# Patient Record
Sex: Female | Born: 1975 | Race: White | Hispanic: No | Marital: Married | Smoking: Never smoker
Health system: Southern US, Community
[De-identification: ages and names within clinical notes are randomized; demographics above are authoritative.]

## PROBLEM LIST (undated history)

## (undated) DIAGNOSIS — R609 Edema, unspecified: Secondary | ICD-10-CM

## (undated) DIAGNOSIS — D649 Anemia, unspecified: Secondary | ICD-10-CM

## (undated) DIAGNOSIS — F418 Other specified anxiety disorders: Secondary | ICD-10-CM

## (undated) DIAGNOSIS — M1990 Unspecified osteoarthritis, unspecified site: Secondary | ICD-10-CM

## (undated) DIAGNOSIS — M5134 Other intervertebral disc degeneration, thoracic region: Secondary | ICD-10-CM

## (undated) DIAGNOSIS — M51369 Other intervertebral disc degeneration, lumbar region without mention of lumbar back pain or lower extremity pain: Secondary | ICD-10-CM

## (undated) DIAGNOSIS — G4700 Insomnia, unspecified: Secondary | ICD-10-CM

## (undated) DIAGNOSIS — Z8739 Personal history of other diseases of the musculoskeletal system and connective tissue: Secondary | ICD-10-CM

## (undated) DIAGNOSIS — N63 Unspecified lump in unspecified breast: Secondary | ICD-10-CM

## (undated) DIAGNOSIS — Z8719 Personal history of other diseases of the digestive system: Secondary | ICD-10-CM

## (undated) DIAGNOSIS — R9431 Abnormal electrocardiogram [ECG] [EKG]: Secondary | ICD-10-CM

## (undated) DIAGNOSIS — M5136 Other intervertebral disc degeneration, lumbar region: Secondary | ICD-10-CM

## (undated) DIAGNOSIS — G629 Polyneuropathy, unspecified: Secondary | ICD-10-CM

## (undated) DIAGNOSIS — Z87442 Personal history of urinary calculi: Secondary | ICD-10-CM

## (undated) DIAGNOSIS — E039 Hypothyroidism, unspecified: Secondary | ICD-10-CM

## (undated) DIAGNOSIS — B86 Scabies: Secondary | ICD-10-CM

## (undated) DIAGNOSIS — R002 Palpitations: Secondary | ICD-10-CM

## (undated) DIAGNOSIS — M5126 Other intervertebral disc displacement, lumbar region: Secondary | ICD-10-CM

## (undated) DIAGNOSIS — R519 Headache, unspecified: Secondary | ICD-10-CM

## (undated) DIAGNOSIS — M797 Fibromyalgia: Secondary | ICD-10-CM

## (undated) DIAGNOSIS — I7774 Dissection of vertebral artery: Secondary | ICD-10-CM

## (undated) DIAGNOSIS — R5381 Other malaise: Secondary | ICD-10-CM

## (undated) DIAGNOSIS — J189 Pneumonia, unspecified organism: Secondary | ICD-10-CM

## (undated) DIAGNOSIS — K589 Irritable bowel syndrome without diarrhea: Secondary | ICD-10-CM

## (undated) DIAGNOSIS — Z973 Presence of spectacles and contact lenses: Secondary | ICD-10-CM

## (undated) DIAGNOSIS — T7840XA Allergy, unspecified, initial encounter: Secondary | ICD-10-CM

## (undated) DIAGNOSIS — IMO0002 Reserved for concepts with insufficient information to code with codable children: Secondary | ICD-10-CM

## (undated) DIAGNOSIS — M5124 Other intervertebral disc displacement, thoracic region: Secondary | ICD-10-CM

## (undated) DIAGNOSIS — K219 Gastro-esophageal reflux disease without esophagitis: Secondary | ICD-10-CM

## (undated) DIAGNOSIS — R5383 Other fatigue: Secondary | ICD-10-CM

## (undated) DIAGNOSIS — I639 Cerebral infarction, unspecified: Secondary | ICD-10-CM

## (undated) DIAGNOSIS — I10 Essential (primary) hypertension: Secondary | ICD-10-CM

## (undated) DIAGNOSIS — M549 Dorsalgia, unspecified: Secondary | ICD-10-CM

## (undated) DIAGNOSIS — M542 Cervicalgia: Secondary | ICD-10-CM

## (undated) DIAGNOSIS — N390 Urinary tract infection, site not specified: Secondary | ICD-10-CM

## (undated) DIAGNOSIS — I341 Nonrheumatic mitral (valve) prolapse: Secondary | ICD-10-CM

## (undated) HISTORY — PX: CHOLECYSTECTOMY: SHX55

## (undated) HISTORY — DX: Scabies: B86

## (undated) HISTORY — DX: Dissection of vertebral artery: I77.74

## (undated) HISTORY — DX: Allergy, unspecified, initial encounter: T78.40XA

## (undated) HISTORY — DX: Nonrheumatic mitral (valve) prolapse: I34.1

## (undated) HISTORY — PX: MOUTH SURGERY: SHX715

## (undated) HISTORY — DX: Edema, unspecified: R60.9

## (undated) HISTORY — DX: Reserved for concepts with insufficient information to code with codable children: IMO0002

## (undated) HISTORY — DX: Cervicalgia: M54.2

## (undated) HISTORY — DX: Other fatigue: R53.83

## (undated) HISTORY — DX: Urinary tract infection, site not specified: N39.0

## (undated) HISTORY — DX: Dorsalgia, unspecified: M54.9

## (undated) HISTORY — DX: Other malaise: R53.81

## (undated) HISTORY — DX: Unspecified osteoarthritis, unspecified site: M19.90

## (undated) HISTORY — DX: Insomnia, unspecified: G47.00

## (undated) HISTORY — DX: Other specified anxiety disorders: F41.8

## (undated) HISTORY — DX: Essential (primary) hypertension: I10

## (undated) HISTORY — PX: BREAST EXCISIONAL BIOPSY: SUR124

## (undated) HISTORY — PX: OTHER SURGICAL HISTORY: SHX169

## (undated) HISTORY — DX: Irritable bowel syndrome, unspecified: K58.9

---

## 1998-06-25 ENCOUNTER — Other Ambulatory Visit: Admission: RE | Admit: 1998-06-25 | Discharge: 1998-06-25 | Payer: Self-pay | Admitting: Obstetrics & Gynecology

## 1998-11-08 ENCOUNTER — Ambulatory Visit (HOSPITAL_COMMUNITY): Admission: RE | Admit: 1998-11-08 | Discharge: 1998-11-08 | Payer: Self-pay | Admitting: Obstetrics & Gynecology

## 1999-02-04 ENCOUNTER — Inpatient Hospital Stay (HOSPITAL_COMMUNITY): Admission: AD | Admit: 1999-02-04 | Discharge: 1999-02-04 | Payer: Self-pay | Admitting: Family Medicine

## 1999-02-09 ENCOUNTER — Inpatient Hospital Stay (HOSPITAL_COMMUNITY): Admission: AD | Admit: 1999-02-09 | Discharge: 1999-02-11 | Payer: Self-pay | Admitting: Obstetrics & Gynecology

## 2000-12-05 ENCOUNTER — Other Ambulatory Visit: Admission: RE | Admit: 2000-12-05 | Discharge: 2000-12-05 | Payer: Self-pay | Admitting: Obstetrics and Gynecology

## 2004-09-29 ENCOUNTER — Other Ambulatory Visit: Admission: RE | Admit: 2004-09-29 | Discharge: 2004-09-29 | Payer: Self-pay | Admitting: Obstetrics & Gynecology

## 2005-10-09 ENCOUNTER — Other Ambulatory Visit: Admission: RE | Admit: 2005-10-09 | Discharge: 2005-10-09 | Payer: Self-pay | Admitting: Obstetrics and Gynecology

## 2009-11-27 DIAGNOSIS — I7774 Dissection of vertebral artery: Secondary | ICD-10-CM

## 2009-11-27 DIAGNOSIS — I639 Cerebral infarction, unspecified: Secondary | ICD-10-CM

## 2009-11-27 HISTORY — DX: Dissection of vertebral artery: I77.74

## 2009-11-27 HISTORY — DX: Cerebral infarction, unspecified: I63.9

## 2011-06-23 ENCOUNTER — Other Ambulatory Visit: Payer: Self-pay | Admitting: Obstetrics and Gynecology

## 2011-06-23 DIAGNOSIS — N644 Mastodynia: Secondary | ICD-10-CM

## 2011-06-27 ENCOUNTER — Other Ambulatory Visit: Payer: Self-pay | Admitting: Obstetrics and Gynecology

## 2011-06-27 ENCOUNTER — Ambulatory Visit
Admission: RE | Admit: 2011-06-27 | Discharge: 2011-06-27 | Disposition: A | Discharge status: Home / Self Care | Payer: BC Managed Care – PPO | Source: Ambulatory Visit | Attending: Obstetrics and Gynecology | Admitting: Obstetrics and Gynecology

## 2011-06-27 DIAGNOSIS — N644 Mastodynia: Secondary | ICD-10-CM

## 2011-07-03 ENCOUNTER — Inpatient Hospital Stay: Admission: RE | Admit: 2011-07-03 | Payer: BC Managed Care – PPO | Source: Ambulatory Visit

## 2011-07-06 ENCOUNTER — Ambulatory Visit
Admission: RE | Admit: 2011-07-06 | Discharge: 2011-07-06 | Disposition: A | Discharge status: Home / Self Care | Payer: BC Managed Care – PPO | Source: Ambulatory Visit | Attending: Obstetrics and Gynecology | Admitting: Obstetrics and Gynecology

## 2011-07-06 DIAGNOSIS — N644 Mastodynia: Secondary | ICD-10-CM

## 2011-10-13 ENCOUNTER — Encounter: Payer: Self-pay | Admitting: Vascular Surgery

## 2011-11-15 ENCOUNTER — Encounter: Payer: Self-pay | Admitting: Vascular Surgery

## 2011-11-16 ENCOUNTER — Encounter: Payer: BC Managed Care – PPO | Admitting: Vascular Surgery

## 2012-05-15 ENCOUNTER — Other Ambulatory Visit: Payer: Self-pay | Admitting: Obstetrics and Gynecology

## 2012-05-26 IMAGING — US US CORE BIOPSY
1 series · 12 of 12 positions shown · non-contrast
Comparison: none

***ADDENDUM*** CREATED: 07/07/2011 [DATE]

Pathology revealed a fibroadenoma in the left breast. This was
found to be concordant by Dr. Gbenyo Rosemary. Pathology was relayed
by telephone. The patient reported doing well after the biopsy with
minimal oozing at the biopsy site. She is currently on aspirin
therapy. Post biopsy instructions were reviewed and her questions
were answered. She was encouraged to call The [REDACTED] for any additional concerns. She was asked to
continue with monthly self breast examination and to return at age
40 for bilateral screening mammography.
Pathology results are dictated by Iteneusz Fluder RN, BSN on [place]
***END ADDENDUM*** SIGNED BY: Shahriar Joshua, M.D.
CLINICAL DATA: Left breast mass, the patient requests biopsy

[Series 2: us core biopsy · 12 of 12 slices shown]
[im 1/12]
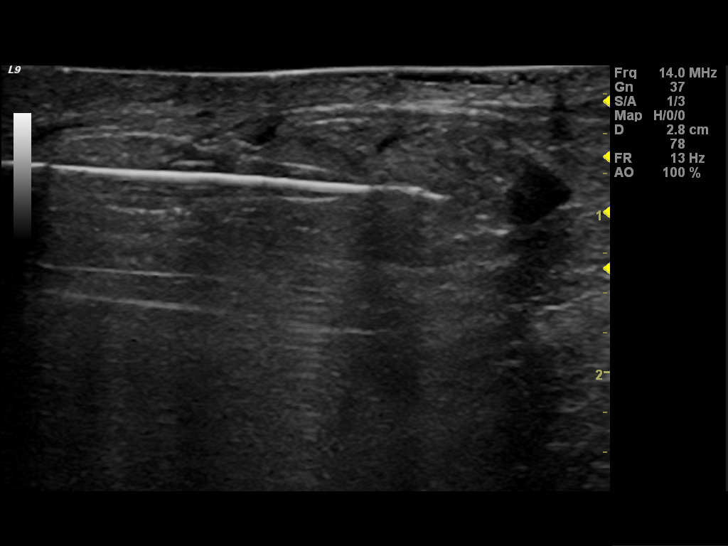
[im 2/12]
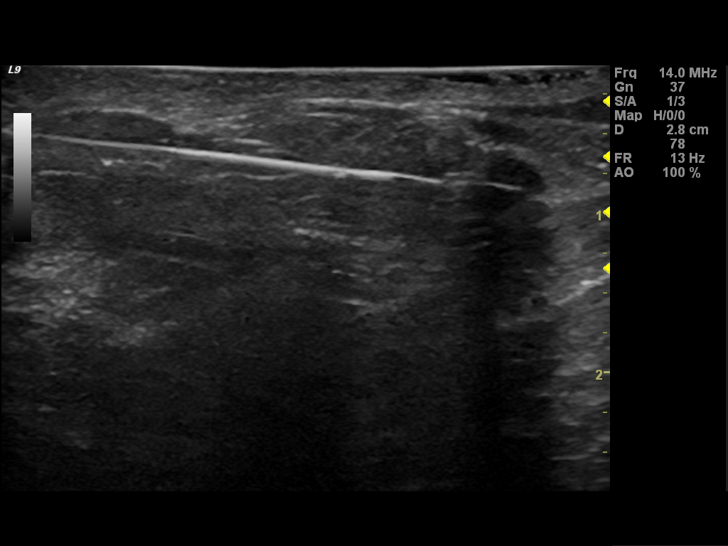
[im 3/12]
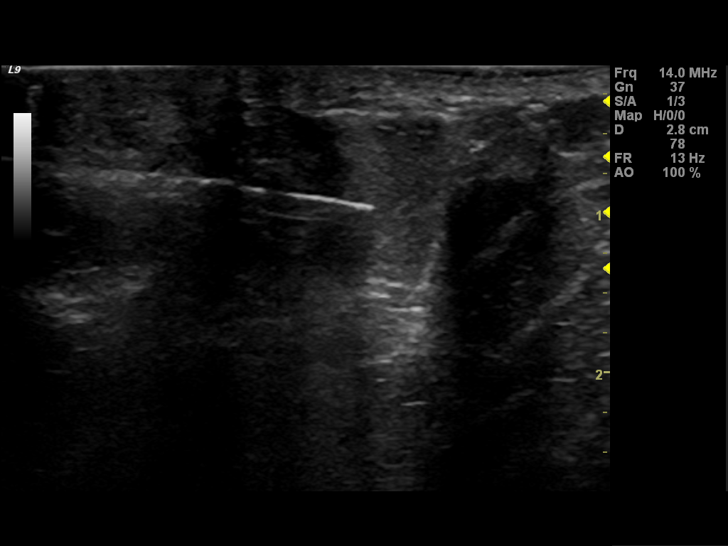
[im 4/12]
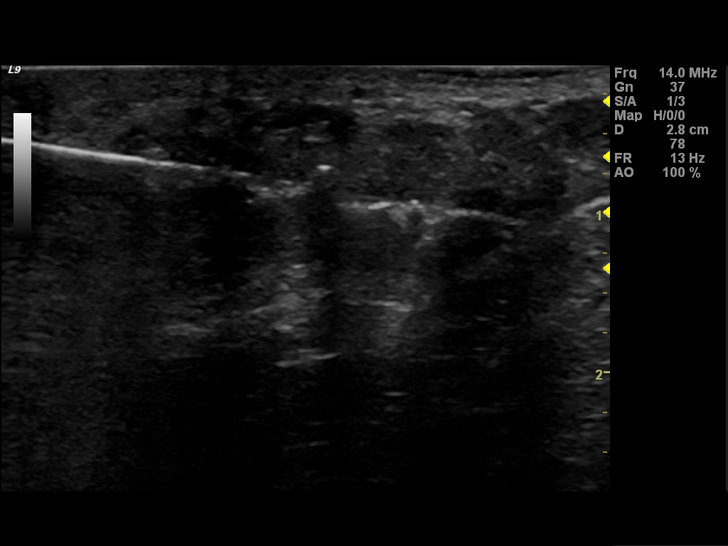
[im 5/12]
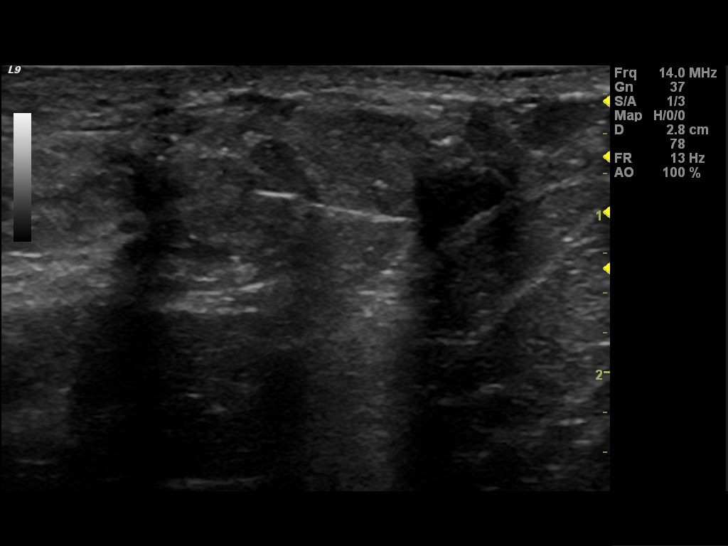
[im 6/12]
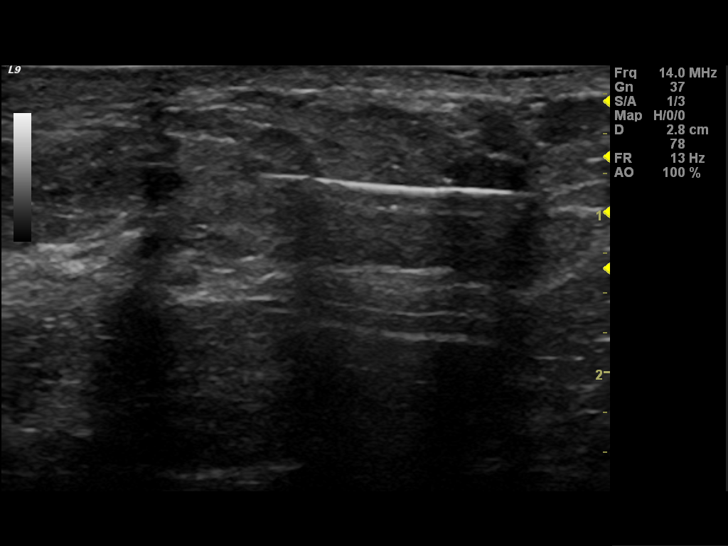
[im 7/12]
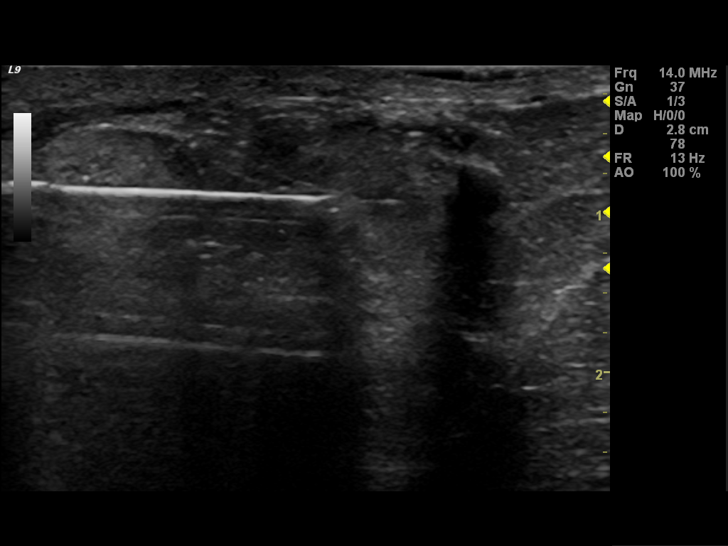
[im 8/12]
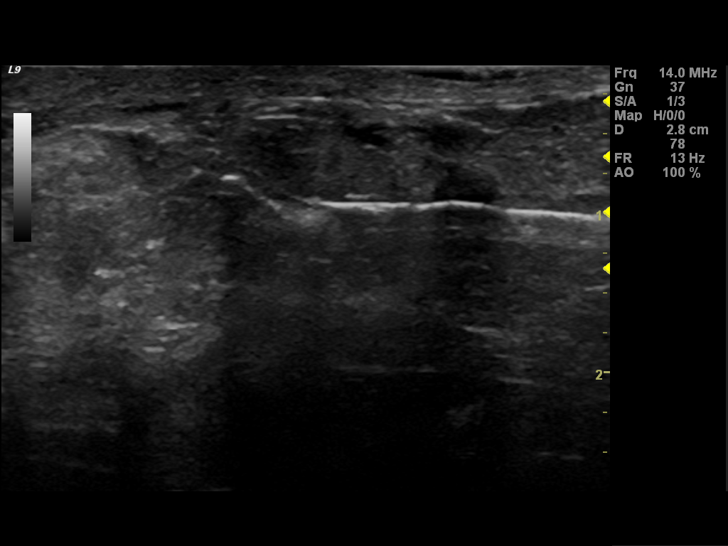
[im 9/12]
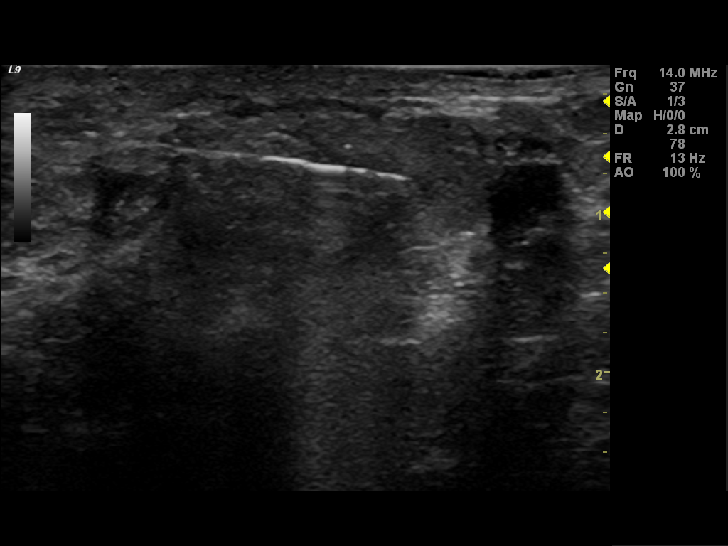
[im 10/12]
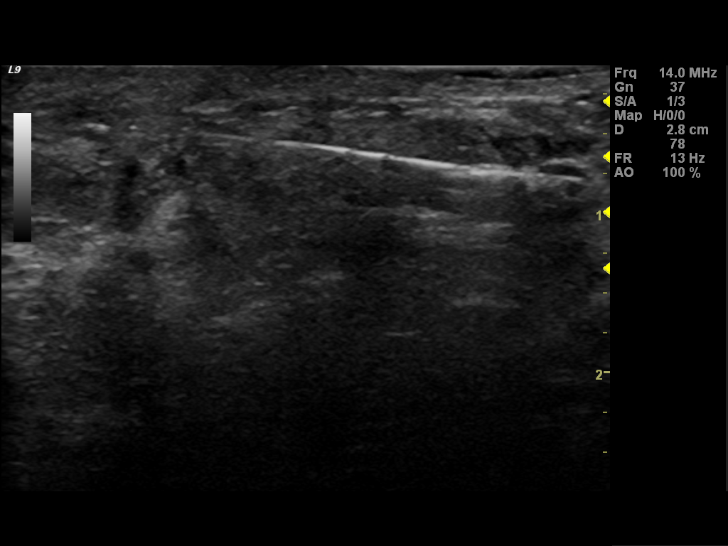
[im 11/12]
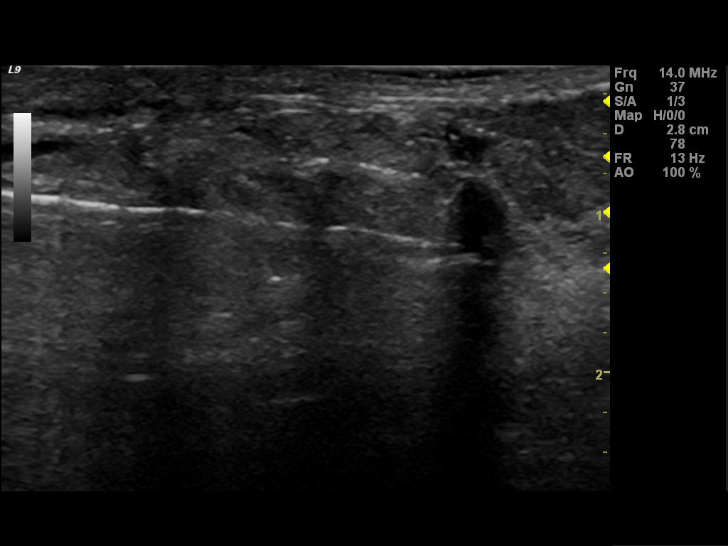
[im 12/12]
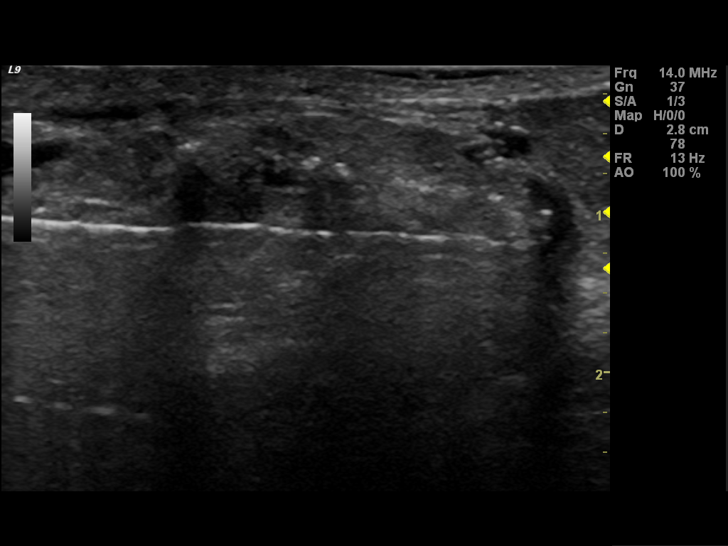

[12 of 12 positions shown; findings below may reference images not displayed]

ULTRASOUND GUIDED CORE BIOPSY OF THE LEFT BREAST

I met with the patient, and we discussed the procedure of
ultrasound-guided biopsy, including risks, benefits, and
alternatives.  Specifically, we discussed the risks of infection,
bleeding, tissue injury, clip migration, and inadequate sampling.
Informed, written consent was given.

Using sterile technique,   lidocaine, ultrasound guidance, and a 14
gauge automated biopsy device, biopsy was performed of 1 cm mass at
the left breast 10 o'clock position 3 cm from nipple.  At the
conclusion of the procedure, a tissue marker clip was deployed into
the biopsy cavity.  Follow-up 2-view mammogram was performed and
dictated separately.
IMPRESSION: Ultrasound-guided biopsy of left breast mass.  No apparent
complications.

## 2013-04-30 ENCOUNTER — Other Ambulatory Visit: Payer: Self-pay | Admitting: Internal Medicine

## 2013-05-14 ENCOUNTER — Ambulatory Visit
Admission: RE | Admit: 2013-05-14 | Discharge: 2013-05-14 | Disposition: A | Discharge status: Home / Self Care | Payer: Self-pay | Source: Ambulatory Visit | Attending: Internal Medicine | Admitting: Internal Medicine

## 2013-10-28 ENCOUNTER — Institutional Professional Consult (permissible substitution): Payer: Self-pay | Admitting: Cardiovascular Disease

## 2013-11-06 ENCOUNTER — Ambulatory Visit: Payer: 59 | Admitting: Neurology

## 2013-12-11 ENCOUNTER — Institutional Professional Consult (permissible substitution): Payer: Self-pay | Admitting: Cardiovascular Disease

## 2013-12-18 DIAGNOSIS — B86 Scabies: Secondary | ICD-10-CM

## 2013-12-18 HISTORY — DX: Scabies: B86

## 2013-12-21 DIAGNOSIS — M549 Dorsalgia, unspecified: Secondary | ICD-10-CM

## 2013-12-21 DIAGNOSIS — R5381 Other malaise: Secondary | ICD-10-CM

## 2013-12-21 DIAGNOSIS — R5383 Other fatigue: Secondary | ICD-10-CM

## 2013-12-21 HISTORY — DX: Other fatigue: R53.83

## 2013-12-21 HISTORY — DX: Other malaise: R53.81

## 2013-12-21 HISTORY — DX: Dorsalgia, unspecified: M54.9

## 2013-12-26 ENCOUNTER — Institutional Professional Consult (permissible substitution): Payer: Self-pay | Admitting: Cardiovascular Disease

## 2014-01-01 ENCOUNTER — Institutional Professional Consult (permissible substitution): Payer: Self-pay | Admitting: Cardiovascular Disease

## 2014-01-22 ENCOUNTER — Institutional Professional Consult (permissible substitution): Payer: Self-pay | Admitting: Cardiovascular Disease

## 2014-02-17 ENCOUNTER — Institutional Professional Consult (permissible substitution): Payer: Self-pay | Admitting: Cardiovascular Disease

## 2018-09-27 ENCOUNTER — Other Ambulatory Visit: Payer: Self-pay

## 2018-09-27 ENCOUNTER — Other Ambulatory Visit: Payer: Self-pay | Admitting: Obstetrics and Gynecology

## 2018-09-27 ENCOUNTER — Encounter (HOSPITAL_BASED_OUTPATIENT_CLINIC_OR_DEPARTMENT_OTHER): Payer: Self-pay

## 2018-09-27 NOTE — Progress Notes (Signed)
Spoke with: Morey Hummingbird NPO:  After Midnight, no gum, candy, or mints   Arrival time: 1030AM Labs: (CBC, BMP, EKG 09/30/2018) AM medications: Levothyroxine, Loratadine, Omeprazole Pre op orders: Yes Ride home:  Chace Bisch (fiance') [phone]

## 2018-09-30 ENCOUNTER — Encounter (HOSPITAL_COMMUNITY)
Admission: RE | Admit: 2018-09-30 | Discharge: 2018-09-30 | Disposition: A | Discharge status: Home / Self Care | Payer: Medicaid Other | Source: Ambulatory Visit | Attending: Obstetrics and Gynecology | Admitting: Obstetrics and Gynecology

## 2018-09-30 DIAGNOSIS — I10 Essential (primary) hypertension: Secondary | ICD-10-CM | POA: Insufficient documentation

## 2018-09-30 DIAGNOSIS — N750 Cyst of Bartholin's gland: Secondary | ICD-10-CM | POA: Insufficient documentation

## 2018-09-30 DIAGNOSIS — I498 Other specified cardiac arrhythmias: Secondary | ICD-10-CM | POA: Insufficient documentation

## 2018-09-30 LAB — BASIC METABOLIC PANEL
Anion gap: 6 (ref 5–15)
BUN: 16 mg/dL (ref 6–20)
CHLORIDE: 102 mmol/L (ref 98–111)
CO2: 31 mmol/L (ref 22–32)
Calcium: 9 mg/dL (ref 8.9–10.3)
Creatinine, Ser: 0.91 mg/dL (ref 0.44–1.00)
GFR calc Af Amer: >60 mL/min (ref >60)
GLUCOSE: 87 mg/dL (ref 70–99)
POTASSIUM: 3.2 mmol/L — AB (ref 3.5–5.1)
SODIUM: 139 mmol/L (ref 135–145)

## 2018-09-30 LAB — CBC
HCT: 42.3 % (ref 36.0–46.0)
HEMOGLOBIN: 13.8 g/dL (ref 12.0–15.0)
MCH: 27.7 pg (ref 26.0–34.0)
MCHC: 32.6 g/dL (ref 30.0–36.0)
MCV: 84.9 fL (ref 80.0–100.0)
NRBC: 0 % (ref 0.0–0.2)
PLATELETS: 236 10*3/uL (ref 150–400)
RBC: 4.98 MIL/uL (ref 3.87–5.11)
RDW: 12.6 % (ref 11.5–15.5)
WBC: 7.3 10*3/uL (ref 4.0–10.5)

## 2018-09-30 NOTE — Progress Notes (Signed)
Spoke with dr Gerre Scull nodal office no old ekg at office, faxed [facility] to see if any old ekg done there, fax confirmation received.

## 2018-10-01 ENCOUNTER — Encounter (HOSPITAL_COMMUNITY)
Admission: RE | Admit: 2018-10-01 | Discharge: 2018-10-01 | Disposition: A | Discharge status: Home / Self Care | Payer: Medicaid Other | Source: Ambulatory Visit | Attending: Obstetrics and Gynecology | Admitting: Obstetrics and Gynecology

## 2018-10-01 NOTE — Progress Notes (Signed)
Spoke with dr foster anesthia about ekg results 09-30-18 and old ekg results Sep 24 2015 done at [place] and patient medical history.. Patient ok for surgery per dr foster.

## 2018-10-02 NOTE — H&P (Signed)
31 [email or web address] of 8-10 cm BGD cyst that increased in size over a weekend  No signs of acute infection.  Past Medical History:  Diagnosis Date  . Abnormal EKG    Short P wave noted per pt, no treatment at this time  . Allergy   . Arthritis   . Asthma    environmental triggers  . Backache, unspecified 12/21/2013  . Benign breast lumps    Bilateral  . Borderline anemia    with pregnancy  . Bulging lumbar disc   . Bulging of thoracic intervertebral disc   . Cervicalgia   . DDD (degenerative disc disease)   . Depression with anxiety   . Edema    Generalized  . Fibromyalgia   . GERD (gastroesophageal reflux disease)   . History of fibromuscular dysplasia   . History of kidney stones   . History of pancreatitis   . Hypertension   . Hypothyroidism   . IBS (irritable bowel syndrome)   . Insomnia   . Lumbar herniated disc    L4-L5, S1, pressing on nerve, has been severe and caused right leg and foot to stop working (this has gotten better)  . Malaise and fatigue 12/21/2013  . Mitral valve prolapse   . Pneumonia   . Scabies 12/18/2013  . Stroke [facility]) 2011   slight right side weakness, right side slight facial asymetry, no too noticeable  . UTI (urinary tract infection)   . 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 Wears contact lenses   . Wears glasses    Past Surgical History:  Procedure Laterality Date  . BREAST EXCISIONAL BIOPSY Left   . CHOLECYSTECTOMY    . epidural steroid injections    . MOUTH SURGERY     tooth extraction age 56    Social History   Socioeconomic History  . Marital status: Single    Spouse name: Not on file  . Number of children: Not on file  . Years of education: Not on file  . Highest education level: Not on file  Occupational History  . Not on file  Social Needs  . Financial resource strain: Not on file  . Food insecurity:    Worry: Not on file    Inability: Not  on file  . Transportation needs:    Medical: Not on file    Non-medical: Not on file  Tobacco Use  . Smoking status: Never Smoker  . Smokeless tobacco: Never Used  Substance and Sexual Activity  . Alcohol use: Never    Frequency: Never  . Drug use: Never  . Sexual activity: Not on file    Comment: Vasectomy  Lifestyle  . Physical activity:    Days per week: Not on file    Minutes per session: Not on file  . Stress: Not on file  Relationships  . Social connections:    Talks on phone: Not on file    Gets together: Not on file    Attends religious service: Not on file    Active member of club or organization: Not on file    Attends meetings of clubs or organizations: Not on file    Relationship status: Not on file  . Intimate partner violence:    Fear of current or ex partner: Not on file    Emotionally abused: Not on file    Physically abused: Not on file    Forced sexual activity:  Not on file  Other Topics Concern  . Not on file  Social History Narrative  . Not on file    No current facility-administered medications on file prior to encounter.    Current Outpatient Medications on File Prior to Encounter  Medication Sig Dispense Refill  . lisinopril (PRINIVIL,ZESTRIL) 5 MG tablet Take 5 mg by mouth daily.    Marland Kitchen omeprazole (PRILOSEC) 20 MG capsule Take 20 mg by mouth as needed.    Marland Kitchen aspirin EC 81 MG tablet Take 81 mg by mouth daily.     . cyclobenzaprine (FLEXERIL) 10 MG tablet Take 10 mg by mouth 3 (three) times daily as needed for muscle spasms.    . diazepam (VALIUM) 10 MG tablet Take 10 mg by mouth every 6 (six) hours as needed for anxiety.    . furosemide (LASIX) 20 MG tablet Take 20 mg by mouth as needed.    . hydrochlorothiazide (HYDRODIURIL) 25 MG tablet Take 25 mg by mouth every evening.     Marland Kitchen levothyroxine (SYNTHROID, LEVOTHROID) 50 MCG tablet Take 50 mcg by mouth every morning.     . loratadine-pseudoephedrine (CLARITIN-D 24-HOUR) 10-240 MG per 24 hr tablet  Take 1 tablet by mouth daily.      . multivitamin-iron-minerals-folic acid (CENTRUM) chewable tablet Chew 1 tablet by mouth daily.      . valACYclovir (VALTREX) 1000 MG tablet Take 1,000 mg by mouth as needed.       Allergies  Allergen Reactions  . Iodine Anaphylaxis    Contrast dye  . Morphine And Related Nausea And Vomiting  . Shellfish Allergy Swelling    Lips swelling and wheezing  . Tylenol [Acetaminophen]     Causes elevated liver enzymes    Vitals:   09/27/18 1637  Weight: 71.7 kg  Height: 5' 4.5" (1.638 m)    Lungs: clear to ascultation Cor:  RRR Abdomen:  soft, nontender, nondistended. Ex:  no cords, erythema Pelvic:  8-10 cm BGD cyst on R.  Normal vaginal otherwise.  Normal uterus and cervix/  A:  For BGD cyst excision and marsupulization.   P: P: All risks, benefits and alternatives d/w patient and she desires to proceed.   SCDs during the operation.    Korina Tretter A

## 2018-10-03 ENCOUNTER — Encounter (HOSPITAL_BASED_OUTPATIENT_CLINIC_OR_DEPARTMENT_OTHER)
Admission: RE | Disposition: A | Discharge status: Home / Self Care | Payer: Self-pay | Source: Ambulatory Visit | Attending: Obstetrics and Gynecology

## 2018-10-03 ENCOUNTER — Ambulatory Visit (HOSPITAL_BASED_OUTPATIENT_CLINIC_OR_DEPARTMENT_OTHER): Payer: Self-pay | Admitting: Anesthesiology

## 2018-10-03 ENCOUNTER — Other Ambulatory Visit: Payer: Self-pay

## 2018-10-03 ENCOUNTER — Encounter (HOSPITAL_BASED_OUTPATIENT_CLINIC_OR_DEPARTMENT_OTHER): Payer: Self-pay | Admitting: *Deleted

## 2018-10-03 ENCOUNTER — Ambulatory Visit (HOSPITAL_BASED_OUTPATIENT_CLINIC_OR_DEPARTMENT_OTHER)
Admission: RE | Admit: 2018-10-03 | Discharge: 2018-10-03 | Disposition: A | Discharge status: Home / Self Care | Payer: Self-pay | Source: Ambulatory Visit | Attending: Obstetrics and Gynecology | Admitting: Obstetrics and Gynecology

## 2018-10-03 DIAGNOSIS — Z886 Allergy status to analgesic agent status: Secondary | ICD-10-CM | POA: Insufficient documentation

## 2018-10-03 DIAGNOSIS — Z8673 Personal history of transient ischemic attack (TIA), and cerebral infarction without residual deficits: Secondary | ICD-10-CM | POA: Insufficient documentation

## 2018-10-03 DIAGNOSIS — G4700 Insomnia, unspecified: Secondary | ICD-10-CM | POA: Insufficient documentation

## 2018-10-03 DIAGNOSIS — E039 Hypothyroidism, unspecified: Secondary | ICD-10-CM | POA: Insufficient documentation

## 2018-10-03 DIAGNOSIS — Z87442 Personal history of urinary calculi: Secondary | ICD-10-CM | POA: Insufficient documentation

## 2018-10-03 DIAGNOSIS — I10 Essential (primary) hypertension: Secondary | ICD-10-CM | POA: Insufficient documentation

## 2018-10-03 DIAGNOSIS — N750 Cyst of Bartholin's gland: Secondary | ICD-10-CM | POA: Insufficient documentation

## 2018-10-03 DIAGNOSIS — K219 Gastro-esophageal reflux disease without esophagitis: Secondary | ICD-10-CM | POA: Insufficient documentation

## 2018-10-03 DIAGNOSIS — M1990 Unspecified osteoarthritis, unspecified site: Secondary | ICD-10-CM | POA: Insufficient documentation

## 2018-10-03 DIAGNOSIS — J45909 Unspecified asthma, uncomplicated: Secondary | ICD-10-CM | POA: Insufficient documentation

## 2018-10-03 DIAGNOSIS — I341 Nonrheumatic mitral (valve) prolapse: Secondary | ICD-10-CM | POA: Insufficient documentation

## 2018-10-03 DIAGNOSIS — F418 Other specified anxiety disorders: Secondary | ICD-10-CM | POA: Insufficient documentation

## 2018-10-03 DIAGNOSIS — Z885 Allergy status to narcotic agent status: Secondary | ICD-10-CM | POA: Insufficient documentation

## 2018-10-03 DIAGNOSIS — Z7982 Long term (current) use of aspirin: Secondary | ICD-10-CM | POA: Insufficient documentation

## 2018-10-03 DIAGNOSIS — M797 Fibromyalgia: Secondary | ICD-10-CM | POA: Insufficient documentation

## 2018-10-03 DIAGNOSIS — K589 Irritable bowel syndrome without diarrhea: Secondary | ICD-10-CM | POA: Insufficient documentation

## 2018-10-03 DIAGNOSIS — Z79899 Other long term (current) drug therapy: Secondary | ICD-10-CM | POA: Insufficient documentation

## 2018-10-03 HISTORY — DX: Personal history of other diseases of the musculoskeletal system and connective tissue: Z87.39

## 2018-10-03 HISTORY — DX: Unspecified lump in unspecified breast: N63.0

## 2018-10-03 HISTORY — DX: Other intervertebral disc degeneration, lumbar region: M51.36

## 2018-10-03 HISTORY — DX: Abnormal electrocardiogram (ECG) (EKG): R94.31

## 2018-10-03 HISTORY — DX: Presence of spectacles and contact lenses: Z97.3

## 2018-10-03 HISTORY — DX: Personal history of other diseases of the digestive system: Z87.19

## 2018-10-03 HISTORY — DX: Cerebral infarction, unspecified: I63.9

## 2018-10-03 HISTORY — DX: Personal history of urinary calculi: Z87.442

## 2018-10-03 HISTORY — PX: BARTHOLIN CYST MARSUPIALIZATION: SHX5383

## 2018-10-03 HISTORY — DX: Other intervertebral disc displacement, thoracic region: M51.24

## 2018-10-03 HISTORY — DX: Gastro-esophageal reflux disease without esophagitis: K21.9

## 2018-10-03 HISTORY — DX: Other intervertebral disc degeneration, thoracic region: M51.34

## 2018-10-03 HISTORY — DX: Other intervertebral disc degeneration, lumbar region without mention of lumbar back pain or lower extremity pain: M51.369

## 2018-10-03 HISTORY — DX: Anemia, unspecified: D64.9

## 2018-10-03 HISTORY — DX: Pneumonia, unspecified organism: J18.9

## 2018-10-03 HISTORY — DX: Fibromyalgia: M79.7

## 2018-10-03 HISTORY — DX: Other intervertebral disc displacement, lumbar region: M51.26

## 2018-10-03 HISTORY — DX: Hypothyroidism, unspecified: E03.9

## 2018-10-03 LAB — POCT PREGNANCY, URINE: Preg Test, Ur: NEGATIVE

## 2018-10-03 SURGERY — MARSUPIALIZATION, CYST, BARTHOLIN'S GLAND
Anesthesia: General | Site: Perineum

## 2018-10-03 MED ORDER — DEXAMETHASONE SODIUM PHOSPHATE 10 MG/ML IJ SOLN
INTRAMUSCULAR | Status: DC | PRN
  Administered 2018-10-03: 10 mg via INTRAVENOUS

## 2018-10-03 MED ORDER — EPHEDRINE SULFATE-NACL 50-0.9 MG/10ML-% IV SOSY
PREFILLED_SYRINGE | INTRAVENOUS | Status: DC | PRN
  Administered 2018-10-03: 10 mg via INTRAVENOUS

## 2018-10-03 MED ORDER — ONDANSETRON HCL 4 MG/2ML IJ SOLN
INTRAMUSCULAR | Status: AC
  Filled 2018-10-03: qty 2

## 2018-10-03 MED ORDER — HYDROMORPHONE HCL 1 MG/ML IJ SOLN
0.2500 mg | INTRAMUSCULAR | Status: DC | PRN
  Filled 2018-10-03: qty 0.5

## 2018-10-03 MED ORDER — SOD CITRATE-CITRIC ACID 500-334 MG/5ML PO SOLN
ORAL | Status: AC
  Filled 2018-10-03: qty 15

## 2018-10-03 MED ORDER — LIDOCAINE-EPINEPHRINE 1 %-1:100000 IJ SOLN
INTRAMUSCULAR | Status: DC | PRN
  Administered 2018-10-03: 10 mL

## 2018-10-03 MED ORDER — SOD CITRATE-CITRIC ACID 500-334 MG/5ML PO SOLN
30.0000 mL | ORAL | Status: AC
  Administered 2018-10-03: 30 mL via ORAL
  Filled 2018-10-03: qty 30

## 2018-10-03 MED ORDER — DEXAMETHASONE SODIUM PHOSPHATE 10 MG/ML IJ SOLN
INTRAMUSCULAR | Status: AC
  Filled 2018-10-03: qty 1

## 2018-10-03 MED ORDER — IBUPROFEN 800 MG PO TABS: 800.0000 mg | ORAL_TABLET | Freq: Three times a day (TID) | ORAL | 0 refills | Status: DC | PRN

## 2018-10-03 MED ORDER — FENTANYL CITRATE (PF) 100 MCG/2ML IJ SOLN
INTRAMUSCULAR | Status: AC
  Filled 2018-10-03: qty 2

## 2018-10-03 MED ORDER — OXYCODONE HCL 5 MG PO TABS: 5.0000 mg | ORAL_TABLET | Freq: Four times a day (QID) | ORAL | 0 refills | Status: DC | PRN

## 2018-10-03 MED ORDER — MIDAZOLAM HCL 2 MG/2ML IJ SOLN
INTRAMUSCULAR | Status: AC
  Filled 2018-10-03: qty 2

## 2018-10-03 MED ORDER — MIDAZOLAM HCL 2 MG/2ML IJ SOLN
INTRAMUSCULAR | Status: DC | PRN
  Administered 2018-10-03: 2 mg via INTRAVENOUS

## 2018-10-03 MED ORDER — FENTANYL CITRATE (PF) 100 MCG/2ML IJ SOLN
INTRAMUSCULAR | Status: DC | PRN
  Administered 2018-10-03: 50 ug via INTRAVENOUS

## 2018-10-03 MED ORDER — EPHEDRINE 5 MG/ML INJ
INTRAVENOUS | Status: AC
  Filled 2018-10-03: qty 10

## 2018-10-03 MED ORDER — LACTATED RINGERS IV SOLN
INTRAVENOUS | Status: DC
  Administered 2018-10-03: 12:00:00 via INTRAVENOUS
  Filled 2018-10-03: qty 1000

## 2018-10-03 MED ORDER — PROMETHAZINE HCL 25 MG/ML IJ SOLN
6.2500 mg | INTRAMUSCULAR | Status: DC | PRN
  Filled 2018-10-03: qty 1

## 2018-10-03 MED ORDER — PROPOFOL 10 MG/ML IV BOLUS
INTRAVENOUS | Status: DC | PRN
  Administered 2018-10-03: 170 mg via INTRAVENOUS

## 2018-10-03 MED ORDER — LIDOCAINE 2% (20 MG/ML) 5 ML SYRINGE
INTRAMUSCULAR | Status: DC | PRN
  Administered 2018-10-03: 50 mg via INTRAVENOUS

## 2018-10-03 SURGICAL SUPPLY — 28 items
BLADE SURG 15 STRL LF DISP TIS (BLADE) ×1 IMPLANT
BLADE SURG 15 STRL SS (BLADE) ×3
CATH BARTHOLIN GLAND 10FR 5CM (CATHETERS) IMPLANT
CATH ROBINSON RED A/P 16FR (CATHETERS) ×3 IMPLANT
DRAPE SHEET LG 3/4 BI-LAMINATE (DRAPES) ×6 IMPLANT
ELECT REM PT RETURN 9FT ADLT (ELECTROSURGICAL)
ELECTRODE REM PT RTRN 9FT ADLT (ELECTROSURGICAL) IMPLANT
GLOVE BIO SURGEON STRL SZ7 (GLOVE) ×3 IMPLANT
GLOVE BIOGEL PI IND STRL 7.0 (GLOVE) ×1 IMPLANT
GLOVE BIOGEL PI INDICATOR 7.0 (GLOVE) ×2
GOWN STRL REUS W/TWL LRG LVL3 (GOWN DISPOSABLE) ×6 IMPLANT
NEEDLE HYPO 22GX1.5 SAFETY (NEEDLE) ×3 IMPLANT
NS IRRIG 1000ML POUR BTL (IV SOLUTION) ×3 IMPLANT
PACK VAGINAL MINOR WOMEN LF (CUSTOM PROCEDURE TRAY) ×3 IMPLANT
PAD OB MATERNITY 4.3X12.25 (PERSONAL CARE ITEMS) ×1 IMPLANT
PAD PREP 24X48 CUFFED NSTRL (MISCELLANEOUS) ×3 IMPLANT
PENCIL BUTTON HOLSTER BLD 10FT (ELECTRODE) ×1 IMPLANT
SUT VIC AB 3-0 CT1 36 (SUTURE) ×4 IMPLANT
SUT VIC AB 3-0 SH 27 (SUTURE) ×3
SUT VIC AB 3-0 SH 27X BRD (SUTURE) ×1 IMPLANT
SUT VICRYL RAPIDE 3 0 (SUTURE) ×2 IMPLANT
SWAB COLLECTION DEVICE MRSA (MISCELLANEOUS) IMPLANT
SWAB CULTURE ESWAB REG 1ML (MISCELLANEOUS) IMPLANT
SYR CONTROL 10ML LL (SYRINGE) ×3 IMPLANT
TOWEL OR 17X24 6PK STRL BLUE (TOWEL DISPOSABLE) ×6 IMPLANT
TUBE CONNECTING 12'X1/4 (SUCTIONS)
TUBE CONNECTING 12X1/4 (SUCTIONS) IMPLANT
YANKAUER SUCT BULB TIP NO VENT (SUCTIONS) IMPLANT

## 2018-10-03 NOTE — Discharge Instructions (Signed)
°  Post Anesthesia Home Care Instructions  Activity: Get plenty of rest for the remainder of the day. A responsible individual must stay with you for 24 hours following the procedure.  For the next 24 hours, DO NOT: -Drive a car -Paediatric nurse -Drink alcoholic beverages -Take any medication unless instructed by your physician -Make any legal decisions or sign important papers.  Meals: Start with liquid foods such as gelatin or soup. Progress to regular foods as tolerated. Avoid greasy, spicy, heavy foods. If nausea and/or vomiting occur, drink only clear liquids until the nausea and/or vomiting subsides. Call your physician if vomiting continues.  Special Instructions/Symptoms: Your throat may feel dry or sore from the anesthesia or the breathing tube placed in your throat during surgery. If this causes discomfort, gargle with warm salt water. The discomfort should disappear within 24 hours.  If you had a scopolamine patch placed behind your ear for the management of post- operative nausea and/or vomiting:  1. The medication in the patch is effective for 72 hours, after which it should be removed.  Wrap patch in a tissue and discard in the trash. Wash hands thoroughly with soap and water. 2. You may remove the patch earlier than 72 hours if you experience unpleasant side effects which may include dry mouth, dizziness or visual disturbances. 3. Avoid touching the patch. Wash your hands with soap and water after contact with the patch.    Bartholin Cyst or Abscess A Bartholin cyst is a fluid-filled sac that forms on a Bartholin gland. Bartholin glands are small glands that are found in the folds of skin (labia) on the sides of the lower opening of the vagina. This type of cyst causes a bulge on the side of the vagina. A cyst that is not large or infected may not cause problems. However, if the fluid in the cyst becomes infected, the cyst can turn into an abscess. An abscess may cause  discomfort or pain. Follow these instructions at home:  Take medicines only as told by your doctor.  If you were prescribed an antibiotic medicine, finish all of it even if you start to feel better.  Apply warm, wet compresses to the area or take warm, shallow baths that cover your pelvic area (sitz baths). Do this many times each day or as told by your doctor.  Do not squeeze the cyst. Do not apply heavy pressure to it.  Do not have sex until the cyst has gone away.  If your cyst or abscess was opened by your doctor, a small piece of gauze or a drain may have been placed in the area. That lets the cyst drain. Do not remove the gauze or the drain until your doctor tells you it is okay to do that.  Do not wear tampons. Wear feminine pads as needed for any fluid or blood.  Keep all follow-up visits as told by your doctor. This is important. Contact a doctor if:  Your pain, puffiness (swelling), or redness in the area of the cyst gets worse.  You have fluid or pus pus coming from the cyst.  You have a fever. This information is not intended to replace advice given to you by your health care provider. Make sure you discuss any questions you have with your health care provider. Document Released: 02/09/2009 Document Revised: 04/20/2016 Document Reviewed: 06/29/2014 Elsevier Interactive Patient Education  2018 [employer].

## 2018-10-03 NOTE — Transfer of Care (Signed)
Immediate Anesthesia Transfer of Care Note  Patient: Patricia Griffin  Procedure(s) Performed: Procedure(s) (LRB): BARTHOLIN CYST MARSUPIALIZATION (N/A)  Patient Location: PACU  Anesthesia Type: General  Level of Consciousness: awake, oriented, sedated and patient cooperative  Airway & Oxygen Therapy: Patient Spontanous Breathing and Patient connected to face mask oxygen  Post-op Assessment: Report given to PACU RN and Post -op Vital signs reviewed and stable  Post vital signs: Reviewed and stable  Complications: No apparent anesthesia complications Last Vitals:  Vitals Value Taken Time  BP 139/84 10/03/2018  1:30 PM  Temp    Pulse 79 10/03/2018  1:41 PM  Resp 19 10/03/2018  1:41 PM  SpO2 99 % 10/03/2018  1:41 PM  Vitals shown include unvalidated device data.  Last Pain:  Vitals:   10/03/18 1324  TempSrc:   PainSc: 3

## 2018-10-03 NOTE — Anesthesia Procedure Notes (Signed)
Procedure Name: LMA Insertion Date/Time: 10/03/2018 12:22 PM Performed by: Suan Halter, CRNA Pre-anesthesia Checklist: Patient identified, Emergency Drugs available, Suction available and Patient being monitored Patient Re-evaluated:Patient Re-evaluated prior to induction Oxygen Delivery Method: Circle system utilized Preoxygenation: Pre-oxygenation with 100% oxygen Induction Type: IV induction Ventilation: Mask ventilation without difficulty LMA: LMA inserted LMA Size: 4.0 Number of attempts: 1 Airway Equipment and Method: Bite block Placement Confirmation: positive ETCO2 Tube secured with: Tape Dental Injury: Teeth and Oropharynx as per pre-operative assessment

## 2018-10-03 NOTE — Anesthesia Preprocedure Evaluation (Addendum)
Anesthesia Evaluation  Patient identified by MRN, date of birth, ID band Patient awake    Reviewed: Allergy & Precautions, NPO status , Patient's Chart, lab work & pertinent test results  Airway Mallampati: II  TM Distance: >3 FB Neck ROM: Full    Dental  (+) Teeth Intact, Dental Advisory Given   Pulmonary asthma ,    breath sounds clear to auscultation       Cardiovascular hypertension, Pt. on medications  Rhythm:Regular Rate:Normal     Neuro/Psych Anxiety Depression  Neuromuscular disease CVA, Residual Symptoms    GI/Hepatic Neg liver ROS, GERD  Medicated,  Endo/Other  Hypothyroidism   Renal/GU negative Renal ROS     Musculoskeletal  (+) Arthritis , Osteoarthritis,  Fibromyalgia -  Abdominal Normal abdominal exam  (+)   Peds  Hematology   Anesthesia Other Findings   Reproductive/Obstetrics                            Anesthesia Physical Anesthesia Plan  ASA: III  Anesthesia Plan: General   Post-op Pain Management:    Induction: Intravenous  PONV Risk Score and Plan: 4 or greater and Ondansetron, Dexamethasone, Midazolam and Scopolamine patch - Pre-op  Airway Management Planned: LMA  Additional Equipment: None  Intra-op Plan:   Post-operative Plan: Extubation in OR  Informed Consent: I have reviewed the patients History and Physical, chart, labs and discussed the procedure including the risks, benefits and alternatives for the proposed anesthesia with the patient or authorized representative who has indicated his/her understanding and acceptance.   Dental advisory given  Plan Discussed with: CRNA  Anesthesia Plan Comments:        Anesthesia Quick Evaluation

## 2018-10-03 NOTE — Op Note (Signed)
10/03/2018  1:17 PM  PATIENT:  Patricia Griffin  42 y.o. female  PRE-OPERATIVE DIAGNOSIS:  BARTHOLIN'S CYST  POST-OPERATIVE DIAGNOSIS:  BARTHOLIN'S CYST  PROCEDURE:  Procedure(s): BARTHOLIN CYST MARSUPIALIZATION (N/A)  SURGEON:  Surgeon(s) and Role:    * Bobbye Charleston, MD - Primary  ANESTHESIA:   general  EBL:  100 mL   LOCAL MEDICATIONS USED:  LIDOCAINE   SPECIMEN:  Source of Specimen:  Bartholins or vulvar duct cyst  DISPOSITION OF SPECIMEN:  PATHOLOGY  COUNTS:  YES  TOURNIQUET:  * No tourniquets in log *  DICTATION: .Note written in EPIC  PLAN OF CARE: Discharge to home after PACU  PATIENT DISPOSITION:  PACU - hemodynamically stable.   Delay start of Pharmacological VTE agent (>24hrs) due to surgical blood loss or risk of bleeding: not applicable  Complications: none    Findings:  Left BGD cyst extending from middle labia minora to outside of the vagina.  Cyst may be a gartner's instead of bartholin's because base of cyst was higher up inside the vagina instead of near perineum.  About 50 cc of sterile pus was inside cyst.   Technique:  After adequate anesthesia was achieved, the patient was prepped and draped in usual sterile fashion.  The BGD cyst on the left was identified and the vaginal mucosa was marked right at the base of the cyst.  The scalpel was used to incise the vaginal mucosa partly inside and partly outside the hymenal ring secondary the cyst extended so deeply. .  The cyst was identified and sharply excised with the scalpel.  A few portions of cyst wall anteriorly and laterally were then bluntly dissected from the vaginal or vulvar mucosa and then excised with the metzenbaums.  The base of the gland was anterior and inside the vagina and the base of the cyst with the entry to the gland left intact.  Below the posterior edge of the cyst wall there was a defect in the perineum.  A closure of the left perineal area deep to the mucosa was done with 2-0  vicryl to ensure hemostasis.  The vaginal mucosa over this was closed with 2-0 vicryl.    The remaining cyst wall base was then sutured to the vaginal mucosa around the edges of both in a circumferential manner, thus keeping an opening of 2 cm of the cyst.  This was done with 3-O vicryl R.   The area was watched for a minute to make sure there was no hematoma underneath developing and the edges were hemostatic. Lidocaine with epi was injected into the incision area to help with pain control. The patient tolerated the procedure well and was returned to the recovery room in stable condition.    Trevin Gartrell A

## 2018-10-03 NOTE — Progress Notes (Signed)
There has been no change in the patients history, status or exam since the history and physical.  Vitals:   09/27/18 1637 10/03/18 1031 10/03/18 1113 10/03/18 1142  BP:  138/85    Pulse:  66    Resp:  20    Temp:  98.6 F (37 C)    TempSrc:  Oral    SpO2:  100%    Weight: 71.7 kg   73 kg  Height: 5' 4.5" (1.638 m)  5\' 4"  (1.626 m)     Results for orders placed or performed during the hospital encounter of 10/03/18 (from the past 72 hour(s))  Pregnancy, urine POC     Status: None   Collection Time: 10/03/18 10:40 AM  Result Value Ref Range   Preg Test, Ur NEGATIVE NEGATIVE    Comment:        THE SENSITIVITY OF THIS METHODOLOGY IS >24 mIU/mL     Magdala Brahmbhatt A

## 2018-10-03 NOTE — Anesthesia Postprocedure Evaluation (Signed)
Anesthesia Post Note  Patient: Patricia Griffin  Procedure(s) Performed: BARTHOLIN CYST MARSUPIALIZATION (N/A Perineum)     Patient location during evaluation: PACU Anesthesia Type: General Level of consciousness: sedated Pain management: pain level controlled Vital Signs Assessment: post-procedure vital signs reviewed and stable Respiratory status: spontaneous breathing and respiratory function stable Cardiovascular status: stable Postop Assessment: no apparent nausea or vomiting Anesthetic complications: no    Last Vitals:  Vitals:   10/03/18 1400 10/03/18 1415  BP: 120/78 118/83  Pulse: 81 75  Resp: 15 17  Temp:    SpO2: 99% 96%    Last Pain:  Vitals:   10/03/18 1415  TempSrc:   PainSc: 2                  Biruk Troia DANIEL

## 2018-10-03 NOTE — Brief Op Note (Signed)
10/03/2018  1:17 PM  PATIENT:  Patricia Griffin  42 y.o. female  PRE-OPERATIVE DIAGNOSIS:  BARTHOLIN'S CYST  POST-OPERATIVE DIAGNOSIS:  BARTHOLIN'S CYST  PROCEDURE:  Procedure(s): BARTHOLIN CYST MARSUPIALIZATION (N/A)  SURGEON:  Surgeon(s) and Role:    * Bobbye Charleston, MD - Primary  ANESTHESIA:   general  EBL:  100 mL   LOCAL MEDICATIONS USED:  LIDOCAINE   SPECIMEN:  Source of Specimen:  Bartholins or vulvar duct cyst  DISPOSITION OF SPECIMEN:  PATHOLOGY  COUNTS:  YES  TOURNIQUET:  * No tourniquets in log *  DICTATION: .Note written in EPIC  PLAN OF CARE: Discharge to home after PACU  PATIENT DISPOSITION:  PACU - hemodynamically stable.   Delay start of Pharmacological VTE agent (>24hrs) due to surgical blood loss or risk of bleeding: not applicable

## 2018-10-04 ENCOUNTER — Encounter (HOSPITAL_BASED_OUTPATIENT_CLINIC_OR_DEPARTMENT_OTHER): Payer: Self-pay | Admitting: Obstetrics and Gynecology

## 2020-04-07 ENCOUNTER — Other Ambulatory Visit: Payer: Self-pay | Admitting: Radiology

## 2020-04-07 ENCOUNTER — Other Ambulatory Visit: Payer: Self-pay | Admitting: Physician Assistant

## 2020-04-07 DIAGNOSIS — Z803 Family history of malignant neoplasm of breast: Secondary | ICD-10-CM

## 2020-04-07 DIAGNOSIS — Z1501 Genetic susceptibility to malignant neoplasm of breast: Secondary | ICD-10-CM

## 2020-04-07 DIAGNOSIS — Z1231 Encounter for screening mammogram for malignant neoplasm of breast: Secondary | ICD-10-CM

## 2020-04-14 ENCOUNTER — Ambulatory Visit
Admission: RE | Admit: 2020-04-14 | Discharge: 2020-04-14 | Disposition: A | Discharge status: Home / Self Care | Payer: Medicaid Other | Source: Ambulatory Visit | Attending: Physician Assistant | Admitting: Physician Assistant

## 2020-04-14 ENCOUNTER — Other Ambulatory Visit: Payer: Self-pay

## 2020-04-14 ENCOUNTER — Ambulatory Visit: Payer: Medicaid Other

## 2020-04-14 DIAGNOSIS — Z1231 Encounter for screening mammogram for malignant neoplasm of breast: Secondary | ICD-10-CM

## 2020-04-16 ENCOUNTER — Other Ambulatory Visit: Payer: Self-pay | Admitting: Physician Assistant

## 2020-04-16 DIAGNOSIS — R928 Other abnormal and inconclusive findings on diagnostic imaging of breast: Secondary | ICD-10-CM

## 2020-04-19 ENCOUNTER — Other Ambulatory Visit: Payer: Self-pay | Admitting: Physician Assistant

## 2020-04-21 ENCOUNTER — Other Ambulatory Visit: Payer: No Typology Code available for payment source

## 2020-04-22 ENCOUNTER — Ambulatory Visit: Payer: No Typology Code available for payment source

## 2020-04-22 ENCOUNTER — Ambulatory Visit
Admission: RE | Admit: 2020-04-22 | Discharge: 2020-04-22 | Disposition: A | Discharge status: Home / Self Care | Payer: No Typology Code available for payment source | Source: Ambulatory Visit | Attending: Physician Assistant | Admitting: Physician Assistant

## 2020-04-22 ENCOUNTER — Other Ambulatory Visit: Payer: Self-pay

## 2020-04-22 DIAGNOSIS — R928 Other abnormal and inconclusive findings on diagnostic imaging of breast: Secondary | ICD-10-CM

## 2020-08-25 ENCOUNTER — Other Ambulatory Visit: Payer: Self-pay | Admitting: Obstetrics and Gynecology

## 2020-10-14 NOTE — Progress Notes (Signed)
[place]  10/14/2020      Your procedure is scheduled on  10/26/2020   Report to [place].M.  Call this number if you have problems the morning of surgery:[phone]  OUR ADDRESS IS [place], WE ARE LOCATED IN THE [facility]ICAL [facility] WITH [facility].   Remember:  Do not eat food  after midnight. NO SOLID FOOD AFTER MIDNIGHT THE NIGHT PRIOR TO SURGERY. NOTHING BY MOUTH EXCEPT CLEAR LIQUIDS UNTIL     0430am. PLEASE FINISH ENSURE DRINK PER SURGEON ORDER  WHICH NEEDS TO BE COMPLETED AT .0430am  Take these medicines the morning of surgery with A SIP OF WATER Synthroid   Do not wear jewelry, make-up or nail polish.  Do not wear lotions, powders, or perfumes, or deoderant.  Do not shave 48 hours prior to surgery.  Men may shave face and neck.  Do not bring valuables to the hospital.  [facility] is not responsible for any belongings or valuables.  Contacts, dentures or bridgework may not be worn into surgery.  Leave your suitcase in the car.  After surgery it may be brought to your room.  For patients admitted to the hospital, discharge time will be determined by your treatment team.  Patients discharged the day of surgery will not be allowed to drive home.     Please read over the following fact sheets that you were given: [facility] - Preparing for Surgery Before surgery, you can play an important role.  Because skin is not sterile, your skin needs to be as free of germs as possible.  You can reduce the number of germs on your skin by washing with CHG (chlorahexidine gluconate) soap before surgery.  CHG is an antiseptic cleaner which kills germs and bonds with the skin to continue killing germs even after washing. Please DO NOT use if you have an allergy to CHG or antibacterial soaps.  If your skin becomes reddened/irritated stop using the CHG and inform your nurse when you arrive at Short Stay. Do not shave (including legs and  underarms) for at least 48 hours prior to the first CHG shower.  You may shave your face/neck. Please follow these instructions carefully:  1.  Shower with CHG Soap the night before surgery and the  morning of Surgery.  2.  If you choose to wash your hair, wash your hair first as usual with your  normal  shampoo.  3.  After you shampoo, rinse your hair and body thoroughly to remove the  shampoo.                           4.  Use CHG as you would any other liquid soap.  You can apply chg directly  to the skin and wash                       Gently with a scrungie or clean washcloth.  5.  Apply the CHG Soap to your body ONLY FROM THE NECK DOWN.   Do not use on face/ open                           Wound or open sores. Avoid contact with eyes, ears mouth and genitals (private parts).  Wash face,  Genitals (private parts) with your normal soap.             6.  Wash thoroughly, paying special attention to the area where your surgery  will be performed.  7.  Thoroughly rinse your body with warm water from the neck down.  8.  DO NOT shower/wash with your normal soap after using and rinsing off  the CHG Soap.                9.  Pat yourself dry with a clean towel.            10.  Wear clean pajamas.            11.  Place clean sheets on your bed the night of your first shower and do not  sleep with pets. Day of Surgery : Do not apply any lotions/deodorants the morning of surgery.  Please wear clean clothes to the hospital/surgery center.  FAILURE TO FOLLOW THESE INSTRUCTIONS MAY RESULT IN THE CANCELLATION OF YOUR SURGERY PATIENT SIGNATURE_________________________________  NURSE SIGNATURE__________________________________  ________________________________________________________________________

## 2020-10-19 ENCOUNTER — Encounter (HOSPITAL_COMMUNITY)
Admission: RE | Admit: 2020-10-19 | Discharge: 2020-10-19 | Disposition: A | Discharge status: Home / Self Care | Payer: No Typology Code available for payment source | Source: Ambulatory Visit | Attending: Obstetrics and Gynecology | Admitting: Obstetrics and Gynecology

## 2020-10-26 ENCOUNTER — Encounter (HOSPITAL_BASED_OUTPATIENT_CLINIC_OR_DEPARTMENT_OTHER): Admission: RE | Payer: Self-pay | Source: Home / Self Care

## 2020-10-26 ENCOUNTER — Ambulatory Visit (HOSPITAL_BASED_OUTPATIENT_CLINIC_OR_DEPARTMENT_OTHER): Admission: RE | Admit: 2020-10-26 | Payer: 59 | Source: Home / Self Care | Admitting: Obstetrics and Gynecology

## 2020-10-26 SURGERY — XI ROBOTIC ASSISTED LAPAROSCOPIC HYSTERECTOMY AND SALPINGECTOMY
Anesthesia: General

## 2020-11-27 DIAGNOSIS — U071 COVID-19: Secondary | ICD-10-CM

## 2020-11-27 HISTORY — DX: COVID-19: U07.1

## 2021-03-13 IMAGING — MG MM DIGITAL DIAGNOSTIC UNILAT*R* W/ TOMO W/ CAD
6 series · 6 of 18 positions shown · non-contrast
Comparison: Previous exam(s).

CLINICAL DATA: 44-year-old female with possible RIGHT breast
asymmetry on 2D screening mammogram.Very strong family history of
breast cancer.

EXAM:
DIGITAL DIAGNOSTIC UNILATERAL RIGHT MAMMOGRAM WITH CAD AND TOMO

[R CC synth-2D (1 of 2)]
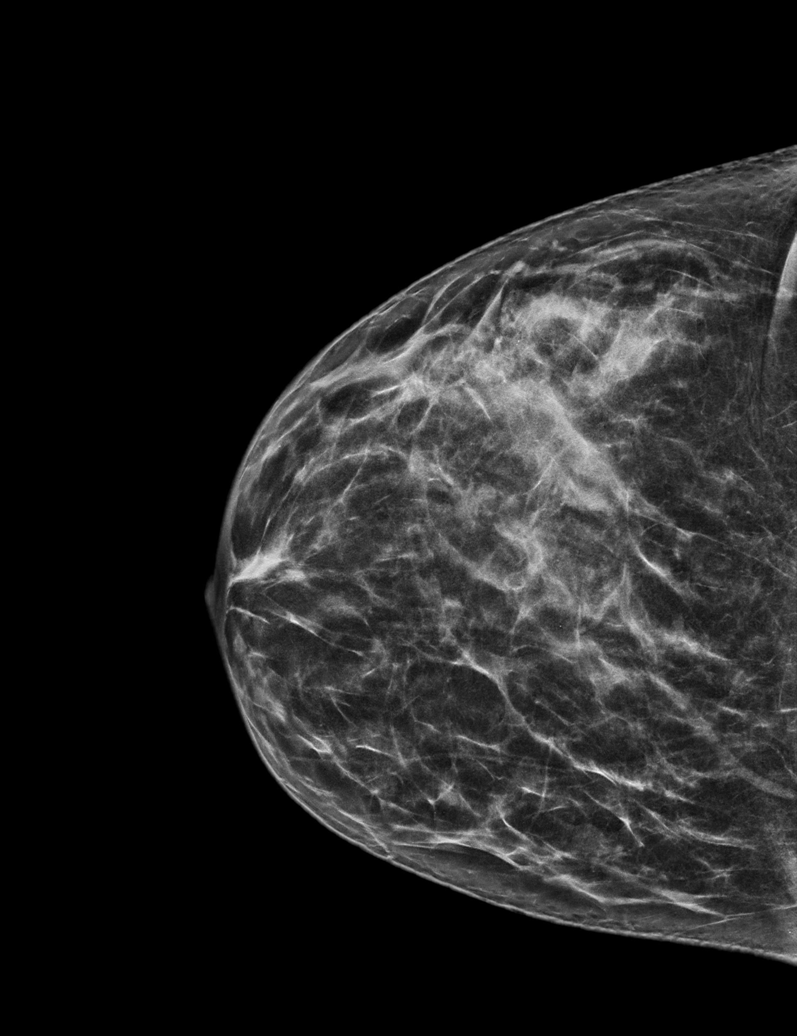

[R CC synth-2D (2 of 2)]
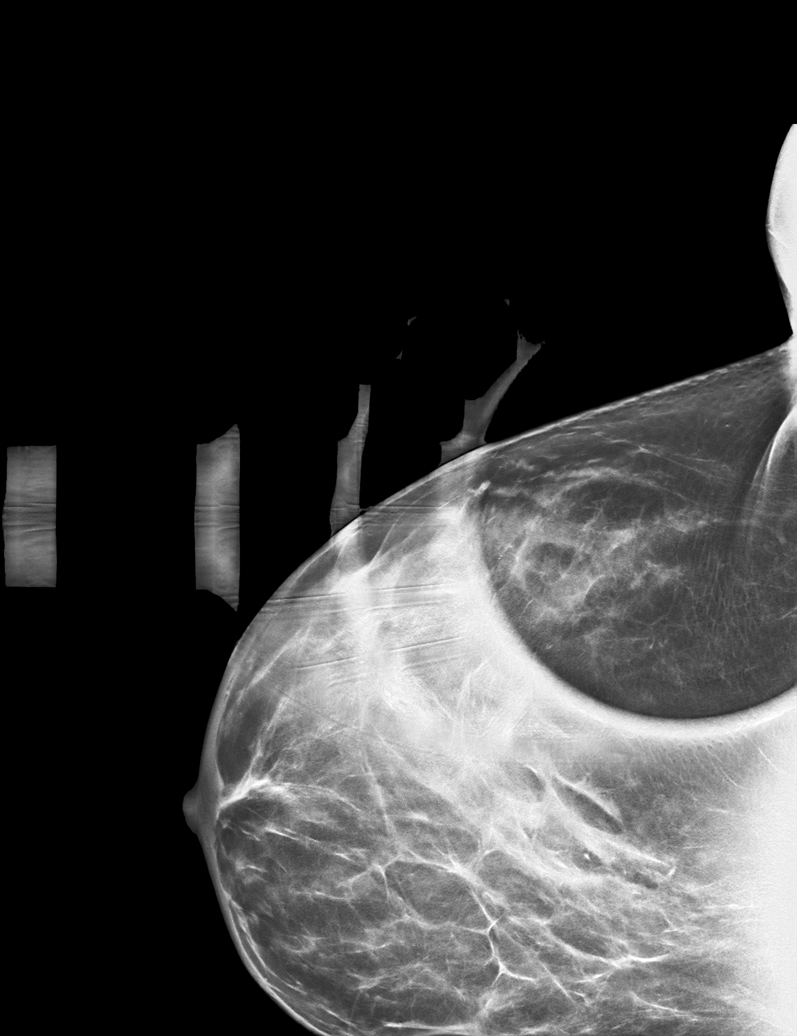

[R MLO synth-2D]
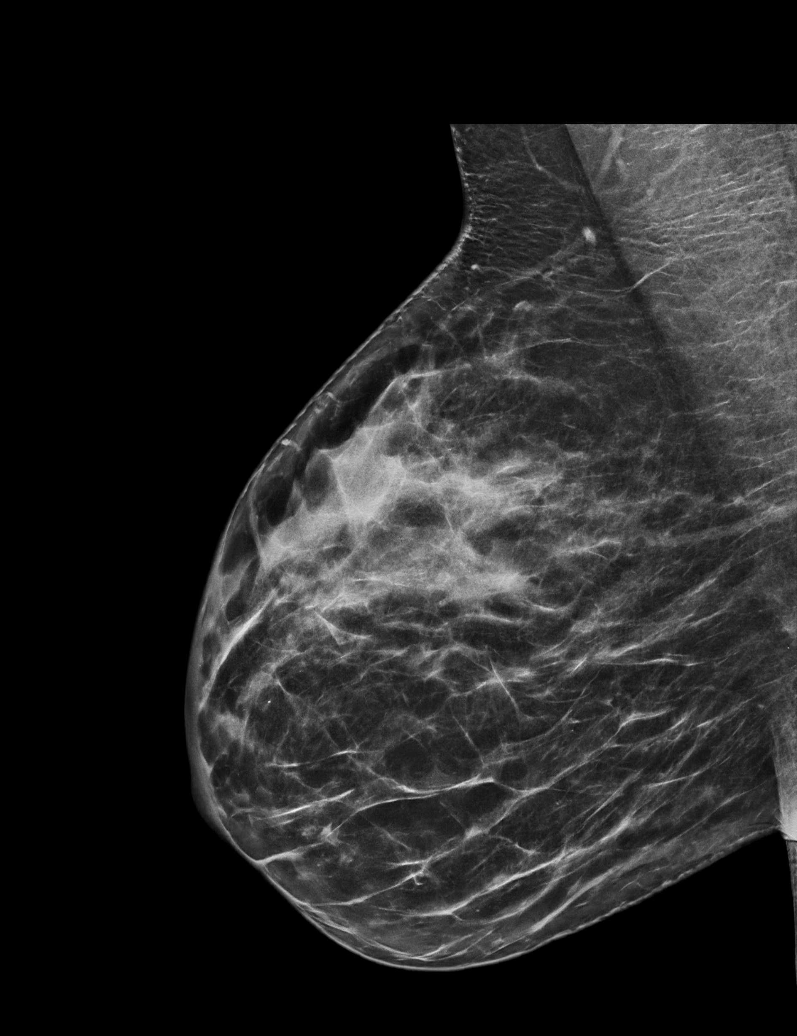

[R MLO tomo · tomo slice 33/65.0]
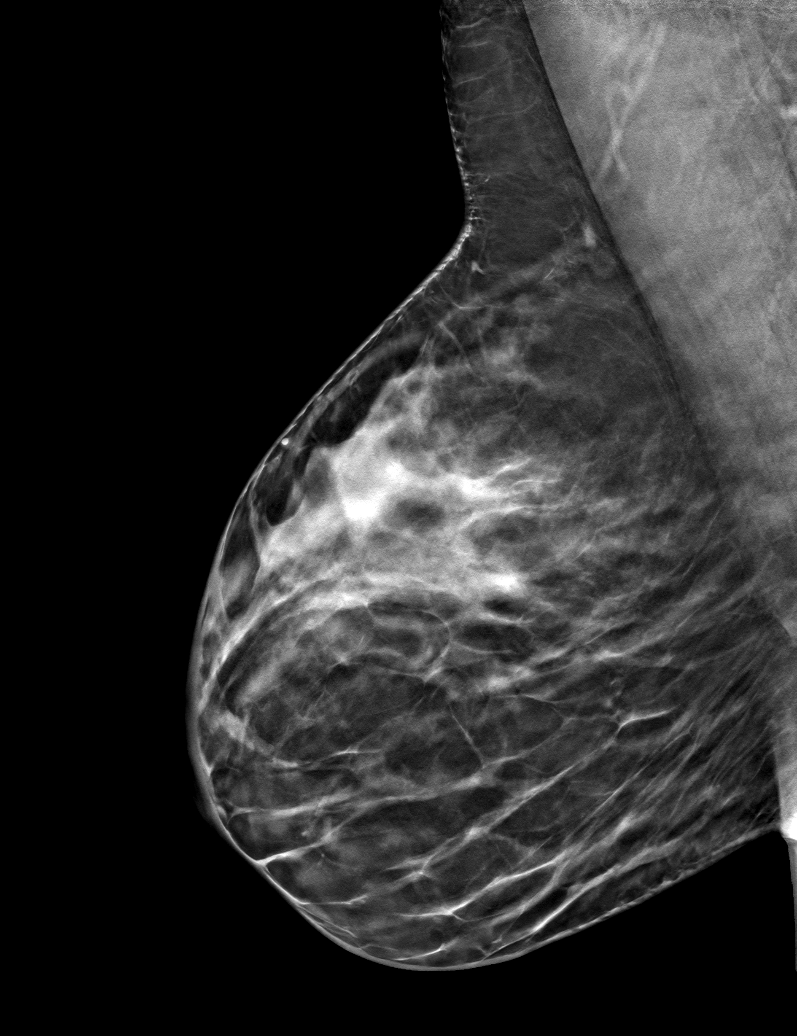

[R CC tomo (1 of 2) · tomo slice 29/58.0]
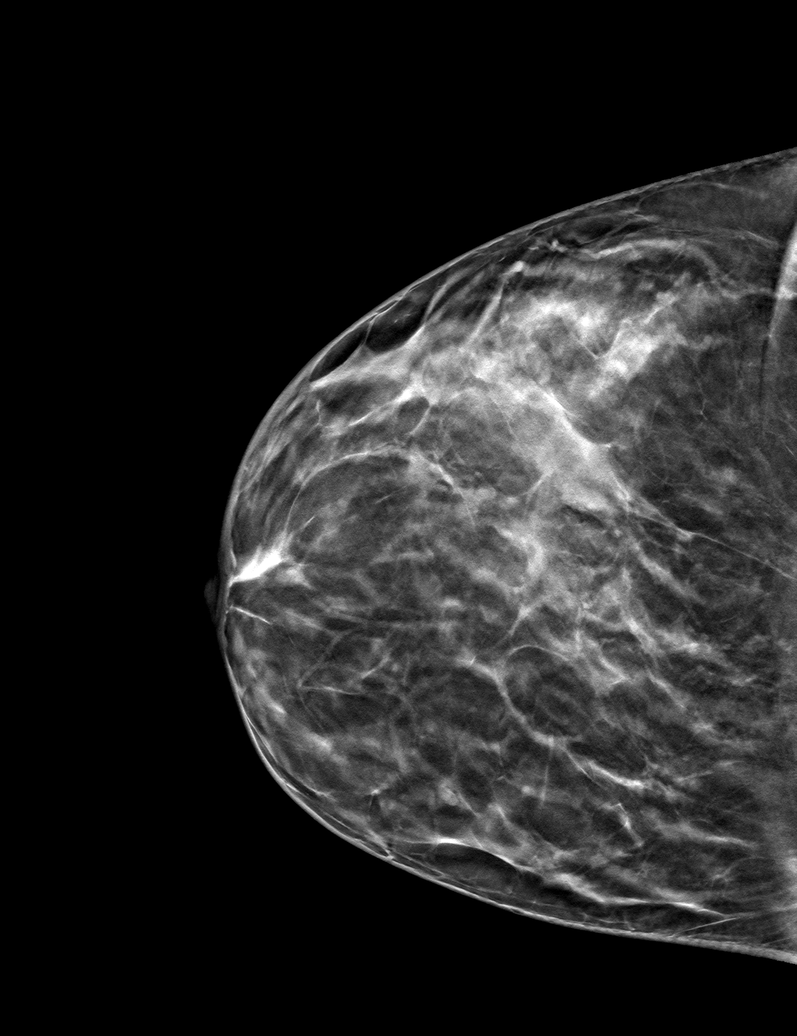

[R CC tomo (2 of 2) · tomo slice 29/57.0]
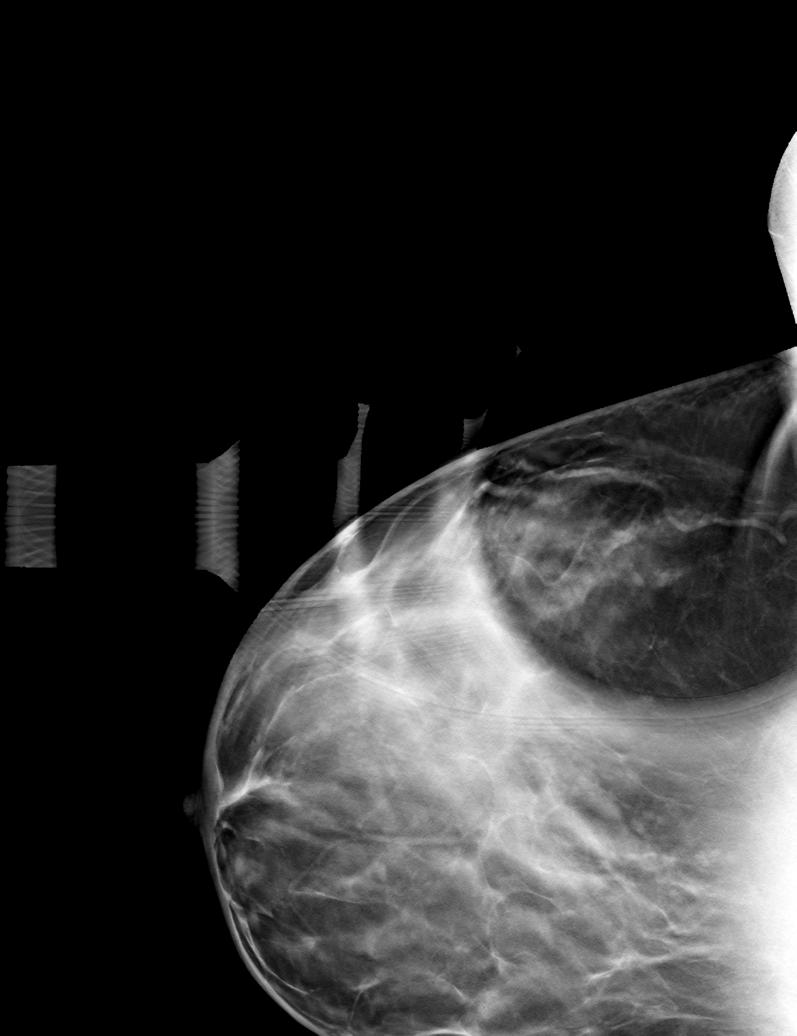

[6 of 18 positions shown; findings below may reference images not displayed]

ACR Breast Density Category c: The breast tissue is heterogeneously
dense, which may obscure small masses.
FINDINGS: 2D/3D full field and spot compression views of the RIGHT breast
demonstrate dispersal of the posterior OUTER RIGHT breast asymmetry
on full field and spot compression views. No persistent
abnormalities identified in the area of the screening study finding.
On today's images, this area has a similar appearance to remote
studies.

Mammographic images were processed with CAD.
IMPRESSION: No persistent abnormality at the site of the screening study
finding.

RECOMMENDATION:
Bilateral screening mammogram in 1 year.

Recommend bilateral screening breast MRI if this patient's lifetime
risk of developing breast cancer is greater than 20%.

I have discussed the findings and recommendations with the patient.
If applicable, a reminder letter will be sent to the patient
regarding the next appointment.

BI-RADS CATEGORY  1: Negative.

## 2021-11-27 DIAGNOSIS — IMO0002 Reserved for concepts with insufficient information to code with codable children: Secondary | ICD-10-CM

## 2021-11-27 DIAGNOSIS — M329 Systemic lupus erythematosus, unspecified: Secondary | ICD-10-CM

## 2021-11-27 HISTORY — DX: Systemic lupus erythematosus, unspecified: M32.9

## 2021-11-27 HISTORY — DX: Reserved for concepts with insufficient information to code with codable children: IMO0002

## 2022-03-17 ENCOUNTER — Other Ambulatory Visit: Payer: Self-pay | Admitting: Physician Assistant

## 2022-03-17 DIAGNOSIS — Z1231 Encounter for screening mammogram for malignant neoplasm of breast: Secondary | ICD-10-CM

## 2022-07-01 ENCOUNTER — Other Ambulatory Visit: Payer: Self-pay | Admitting: Obstetrics and Gynecology

## 2022-07-01 DIAGNOSIS — Z01818 Encounter for other preprocedural examination: Secondary | ICD-10-CM

## 2022-07-05 ENCOUNTER — Encounter (HOSPITAL_BASED_OUTPATIENT_CLINIC_OR_DEPARTMENT_OTHER): Payer: Self-pay | Admitting: Obstetrics and Gynecology

## 2022-07-10 ENCOUNTER — Other Ambulatory Visit: Payer: Self-pay

## 2022-07-10 ENCOUNTER — Encounter (HOSPITAL_BASED_OUTPATIENT_CLINIC_OR_DEPARTMENT_OTHER): Payer: Self-pay | Admitting: Obstetrics and Gynecology

## 2022-07-10 NOTE — Progress Notes (Addendum)
Awaiting medical clearance for surgery scheduled on 07/19/22 from Alex Gardener, [facility], [place], [place]. Per Benjamine Mola at [facility], Alex Gardener, PA stated the patient must have a cardiac CTA before he will give medical clearance for surgery. I left a message with Dr. Malachi Carl scheduler [phone] on 07/10/22 and let her know the above information.

## 2022-07-14 ENCOUNTER — Other Ambulatory Visit: Payer: Self-pay

## 2022-07-14 ENCOUNTER — Encounter (HOSPITAL_BASED_OUTPATIENT_CLINIC_OR_DEPARTMENT_OTHER): Payer: Self-pay | Admitting: Obstetrics and Gynecology

## 2022-07-14 NOTE — Progress Notes (Signed)
Spoke w/ via phone for pre-op interview---[facility] needs dos----cbc, cmp, type & screen, EKG              Lab results------06/29/21 cbc & cmp in chart (Pt stated she would fax cbc and cmp dated 07/11/22) I sent an Epic IB to Dr. Philis Pique  on 8/12/04/21 asking if it was okay to use that lab work. Awaiting response as of 07/14/22. COVID test -----patient states asymptomatic no test needed Arrive at -------1130 on Wednesday, 07/19/22 NPO after MN NO Solid Food.  Clear liquids from MN until---1030 Med rec completed Medications to take morning of surgery -----Wellbutrin, Pepcid, Allegra, Levothyroxine, norethindrone, Valtrex, Flexeril prn, Valium prn, Bentyl prn, Flonase prn Diabetic medication -----n/a Patient instructed no nail polish to be worn day of surgery Patient instructed to bring photo id and insurance card day of surgery Patient aware to have Driver (ride ) / caregiver    for 24 hours after surgery - Daniel Patient Special Instructions -----Extended / overnight stay instructions given. Pre-Op special Istructions -----none Patient verbalized understanding of instructions that were given at this phone interview. Patient denies shortness of breath, chest pain, fever, cough at this phone interview.

## 2022-07-14 NOTE — Progress Notes (Signed)
Your procedure is scheduled on Wednesday, 07/19/22.  Report to [facility].   Call this number if you have problems the morning of surgery  :[phone].   OUR ADDRESS IS [address].  [facility] ARE LOCATED IN [facility].  PLEASE BRING YOUR INSURANCE CARD AND PHOTO ID DAY OF SURGERY.  ONLY 2 PEOPLE ARE ALLOWED IN  WAITING  ROOM.                                      REMEMBER:  DO NOT EAT FOOD, CANDY GUM OR MINTS  AFTER MIDNIGHT THE NIGHT BEFORE YOUR SURGERY . YOU MAY HAVE CLEAR LIQUIDS FROM MIDNIGHT THE NIGHT BEFORE YOUR SURGERY UNTIL  10:30 AM. NO CLEAR LIQUIDS AFTER   10:30 AM DAY OF SURGERY.  YOU MAY  BRUSH YOUR TEETH MORNING OF SURGERY AND RINSE YOUR MOUTH OUT, NO CHEWING GUM CANDY OR MINTS.     CLEAR LIQUID DIET   Foods Allowed                                                                     Foods Excluded  Coffee and tea, regular and decaf                             liquids that you cannot  Plain Jell-O                                                                   see through such as: Fruit ices (not with fruit pulp)                                     milk, soups, orange juice  Plain  Popsicles                                    All solid food Carbonated beverages, regular and diet                                    Cranberry, grape and apple juices Sports drinks like Gatorade _____________________________________________________________________     TAKE THESE MEDICATIONS MORNING OF SURGERY: Wellbutrin, Pepcid, Allegra, Levothyroxine, norethindrone, Valtrex, Flexeril if needed, Valium if needed, Bentyl if needed, Flonase if needed   UP TO 4 VISITORS  MAY VISIT IN THE EXTENDED RECOVERY ROOM UNTIL 800 PM ONLY.  ONE  VISITOR AGE 69 AND OVER MAY SPEND THE NIGHT AND MUST BE IN EXTENDED RECOVERY ROOM NO LATER THAN 800 PM . YOUR DISCHARGE TIME AFTER YOU SPEND THE NIGHT IS 900 AM THE MORNING AFTER YOUR  SURGERY.  YOU  MAY PACK A SMALL OVERNIGHT BAG WITH TOILETRIES FOR YOUR OVERNIGHT STAY IF YOU WISH.  YOUR PRESCRIPTION MEDICATIONS WILL BE PROVIDED DURING [place].                                      DO NOT WEAR JEWERLY, MAKE UP. DO NOT WEAR LOTIONS, POWDERS, PERFUMES OR NAIL POLISH ON YOUR FINGERNAILS. TOENAIL POLISH IS OK TO WEAR. DO NOT SHAVE FOR 48 HOURS PRIOR TO DAY OF SURGERY. MEN MAY SHAVE FACE AND NECK. CONTACTS, GLASSES, OR DENTURES MAY NOT BE WORN TO SURGERY.  REMEMBER: NO SMOKING, DRUGS OR ALCOHOL FOR 24 HOURS BEFORE YOUR SURGERY.                                    [facility] IS NOT RESPONSIBLE  FOR ANY BELONGINGS.                                                                    Marland Kitchen           [facility] - Preparing for Surgery Before surgery, you can play an important role.  Because skin is not sterile, your skin needs to be as free of germs as possible.  You can reduce the number of germs on your skin by washing with CHG (chlorahexidine gluconate) soap before surgery.  CHG is an antiseptic cleaner which kills germs and bonds with the skin to continue killing germs even after washing. Please DO NOT use if you have an allergy to CHG or antibacterial soaps.  If your skin becomes reddened/irritated stop using the CHG and inform your nurse when you arrive at Short Stay. Do not shave (including legs and underarms) for at least 48 hours prior to the first CHG shower.  You may shave your face/neck. Please follow these instructions carefully:  1.  Shower with CHG Soap the night before surgery and the  morning of Surgery.  2.  If you choose to wash your hair, wash your hair first as usual with your  normal  shampoo.  3.  After you shampoo, rinse your hair and body thoroughly to remove the  shampoo.                            4.  Use CHG as you would any other liquid soap.  You can apply chg directly  to the skin and wash , please wash your belly button thoroughly with chg soap provided night  before and morning of your surgery.                     Gently with a scrungie or clean washcloth.  5.  Apply the CHG Soap to your body ONLY FROM THE NECK DOWN.   Do not use on face/ open                           Wound or open sores. Avoid contact with eyes, ears mouth and  genitals (private parts).                       Wash face,  Genitals (private parts) with your normal soap.             6.  Wash thoroughly, paying special attention to the area where your surgery  will be performed.  7.  Thoroughly rinse your body with warm water from the neck down.  8.  DO NOT shower/wash with your normal soap after using and rinsing off  the CHG Soap.                9.  Pat yourself dry with a clean towel.            10.  Wear clean pajamas.            11.  Place clean sheets on your bed the night of your first shower and do not  sleep with pets. Day of Surgery : Do not apply any lotions/deodorants the morning of surgery.  Please wear clean clothes to the hospital/surgery center.  IF YOU HAVE ANY SKIN IRRITATION OR PROBLEMS WITH THE SURGICAL SOAP, PLEASE GET A BAR OF GOLD DIAL SOAP AND SHOWER THE NIGHT BEFORE YOUR SURGERY AND THE MORNING OF YOUR SURGERY. PLEASE LET THE NURSE KNOW MORNING OF YOUR SURGERY IF YOU HAD ANY PROBLEMS WITH THE SURGICAL SOAP.   ________________________________________________________________________                                                        QUESTIONS [facility] PRE OP NURSE PHONE [phone].

## 2022-07-14 NOTE — Progress Notes (Signed)
I spoke with patient. She stated that she has an appointment for a cardiac CT on 07/18/22 at 8:30 AM. She stated that she will get the results faxed to [place] on 07/18/22. She also said that she had lab work on 07/11/22 and she would fax that also.

## 2022-07-17 ENCOUNTER — Ambulatory Visit: Payer: No Typology Code available for payment source | Admitting: Internal Medicine

## 2022-07-17 ENCOUNTER — Encounter (HOSPITAL_COMMUNITY)
Admission: RE | Admit: 2022-07-17 | Discharge: 2022-07-17 | Disposition: A | Discharge status: Home / Self Care | Payer: No Typology Code available for payment source | Source: Ambulatory Visit | Attending: Physician Assistant | Admitting: Physician Assistant

## 2022-07-17 DIAGNOSIS — Z01818 Encounter for other preprocedural examination: Secondary | ICD-10-CM

## 2022-07-17 DIAGNOSIS — Z01812 Encounter for preprocedural laboratory examination: Secondary | ICD-10-CM | POA: Insufficient documentation

## 2022-07-18 NOTE — Anesthesia Preprocedure Evaluation (Addendum)
Anesthesia Evaluation  Patient identified by MRN, date of birth, ID band Patient awake    Reviewed: Allergy & Precautions, NPO status , Patient's Chart, lab work & pertinent test results  History of Anesthesia Complications Negative for: history of anesthetic complications  Airway Mallampati: II  TM Distance: >3 FB Neck ROM: Full    Dental  (+) Dental Advisory Given, Teeth Intact   Pulmonary asthma ,    Pulmonary exam normal        Cardiovascular hypertension, Pt. on medications Normal cardiovascular exam   '23 Coronary CT - 1. Mild, nonobstructive, coronary disease involving the mid LAD.  2. There is a patent foramen ovale.  3. Normal LV chamber size and wall thickness.     Neuro/Psych  Headaches, PSYCHIATRIC DISORDERS Anxiety Depression  Neuromuscular disease CVA (vertebral artery stenosis 2011), Residual Symptoms    GI/Hepatic Neg liver ROS, GERD  Controlled and Medicated, IBS    Endo/Other  Hypothyroidism   Renal/GU negative Renal ROS     Musculoskeletal  (+) Arthritis , Fibromyalgia -  Abdominal   Peds  Hematology negative hematology ROS (+)   Anesthesia Other Findings Lupus   Reproductive/Obstetrics                           Anesthesia Physical Anesthesia Plan  ASA: 3  Anesthesia Plan: General   Post-op Pain Management: Toradol IV (intra-op)* and Ketamine IV*   Induction: Intravenous  PONV Risk Score and Plan: 3 and Treatment may vary due to age or medical condition, Ondansetron, Dexamethasone, Midazolam and Scopolamine patch - Pre-op  Airway Management Planned: Oral ETT  Additional Equipment: None  Intra-op Plan:   Post-operative Plan: Extubation in OR  Informed Consent: I have reviewed the patients History and Physical, chart, labs and discussed the procedure including the risks, benefits and alternatives for the proposed anesthesia with the patient or authorized  representative who has indicated his/her understanding and acceptance.     Dental advisory given  Plan Discussed with: CRNA and Anesthesiologist  Anesthesia Plan Comments:       Anesthesia Quick Evaluation

## 2022-07-18 NOTE — H&P (Signed)
46 y.o.  with severe dysmenorrhea.  Previously:"Gyn scan for lower abdominal pain during menses, occasionally feels sharp pain in vagina & feels pressure. Pt's husband has a vasectomy. LMP- 04/05/2022. On POP's has occ brown spotting./dk//  Physician interpretation: 9x9x6; EM 1 cm. Ovaries normal with simple 3 cm cyst on L. 6 cm fibroid in L anterior uterus./mah  Has been on norinthindrone, still has had brown spotting. Had ovarian cyst for long time.  Previous US was [place] today 11.5x9x6; EM 5 mm. LO 6.59 cm with 5x5 cm simple cyst. RO 5 cm with 3 cm hemorrhagic cyst. No FF. 7 cm fibroid- bigger than previously.  Now more uncomfortable and more pain with menses.  Also pt getting tested for Lupus and Anklylosing spondilitis."  For Robo TLH/salpingectomies for symptomatic fibroid uterus. Would keep ovaries unless frankly abnormal.    Past Medical History:  Diagnosis Date   Abnormal EKG    Short P wave noted per pt, no treatment at this time   Allergy    environmental   Arthritis    osteoarthritis and inflamatory arthritis   Asthma    environmental triggers, mild   Backache, unspecified 12/21/2013   Benign breast lumps    Bilateral   Borderline anemia    with pregnancy   Bulging lumbar disc    Bulging of thoracic intervertebral disc    Cervicalgia    COVID-19 2022   moderate symptoms, no infusions or prescription treatments   DDD (degenerative disc disease)    Depression with anxiety    Pt follows with PCP, Tami Ribas, PA at [facility], [place] 04/14/22.   Edema    Generalized   Fibromyalgia    GERD (gastroesophageal reflux disease)    Headache    off and on migraines as of 07/10/22   History of fibromuscular dysplasia    History of kidney stones    History of pancreatitis    several times over a few years around 2005 to 2012 per pt   Hypertension    Pt follows with PCP, Tami Ribas, PA at [facility], [place] 04/04/22.   Hypothyroidism    Pt follows with PCP,  Tami Ribas, PA at [facility], [place] 04/14/22.   IBS (irritable bowel syndrome)    Insomnia    Lumbar herniated disc    L4-L5, S1, pressing on nerve, has been severe and caused right leg and foot to stop working (this has gotten better)   Lupus ([place]) 2023   Pt follows with Dr. Lahoma Rocker, rheumatology, [facility] 06/13/22 per pt. Pt states she was told that she has 3 of 4 lupus markers and had a positive physical exam for lupus and rheumatoid arthritis.   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, (now healed) , pt had subsequent stroke in 2011   Wears contact lenses    Wears glasses  Past Surgical History:  Procedure Laterality Date   BARTHOLIN CYST MARSUPIALIZATION N/A 10/03/2018   Procedure: BARTHOLIN CYST MARSUPIALIZATION;  Surgeon: Bobbye Charleston, MD;  Location: [facility];  Service: Gynecology;  Laterality: N/A;   BREAST EXCISIONAL BIOPSY Left    CHOLECYSTECTOMY     around 2004   epidural steroid injections     to back and SI joint, no longer having injections as of 07/10/22   MOUTH SURGERY     tooth extraction age 36    Social History   Socioeconomic History   Marital status: Married    Spouse name: Not on file   Number of children: Not on file   Years of education: Not on file   Highest education level: Not on file  Occupational History   Not on file  Tobacco Use   Smoking status: Never   Smokeless tobacco:  Never  Vaping Use   Vaping Use: Never used  Substance and Sexual Activity   Alcohol use: Yes    Alcohol/week: 4.0 standard drinks of alcohol    Types: 4 Glasses of wine per week   Drug use: Never   Sexual activity: Yes    Birth control/protection: Other-see comments    Comment: husband had vasectomy  Other Topics Concern   Not on file  Social History Narrative   Not on file   Social Determinants of Health   Financial Resource Strain: Not on file  Food Insecurity: Not on file  Transportation Needs: Not on file  Physical Activity: Not on file  Stress: Not on file  Social Connections: Not on file  Intimate Partner Violence: Not on file    No current facility-administered medications on file prior to encounter.   Current Outpatient Medications on File Prior to Encounter  Medication Sig Dispense Refill   buPROPion (WELLBUTRIN XL) 300 MG 24 hr tablet Take 300 mg by mouth daily.     celecoxib (CELEBREX) 200 MG capsule Take 200 mg by mouth 2 (two) times daily.     dicyclomine (BENTYL) 20 MG tablet Take 20 mg by mouth as needed for spasms.     doxepin (SINEQUAN) 10 MG capsule Take 10 mg by mouth at bedtime as needed.     famotidine (PEPCID) 20 MG tablet Take 20 mg by mouth 2 (two) times daily.     fexofenadine (ALLEGRA) 60 MG tablet Take 60 mg by mouth 2 (two) times daily.     fluticasone (FLONASE) 50 MCG/ACT nasal spray Place into both nostrils daily.     folic acid (FOLVITE) 1 MG tablet Take 1 mg by mouth daily. Takes 2 tablet daily     losartan (COZAAR) 50 MG tablet Take 50 mg by mouth daily.     methotrexate (RHEUMATREX) 2.5 MG tablet Take 2.5 mg by mouth once a week. Caution:Chemotherapy. Protect from light. Currently taking 6 tablets of 2.5 mg one day per week as of 07/10/22.     montelukast (SINGULAIR) 10 MG tablet Take 10 mg by mouth at bedtime.     norethindrone (MICRONOR) 0.35 MG tablet Take 1 tablet by mouth daily.     predniSONE (DELTASONE) 10 MG tablet Take 10 mg by  mouth in the morning and at bedtime. With taper     cyclobenzaprine (FLEXERIL) 10 MG tablet Take 10 mg by mouth 3 (three) times daily as needed for muscle spasms.     diazepam (VALIUM) 10 MG tablet Take 10 mg by mouth every 6 (six) hours as needed for  anxiety.     furosemide (LASIX) 20 MG tablet Take 20 mg by mouth as needed.     hydrochlorothiazide (HYDRODIURIL) 25 MG tablet Take 25 mg by mouth every evening.      levothyroxine (SYNTHROID, LEVOTHROID) 50 MCG tablet Take 50 mcg by mouth every morning.      multivitamin-iron-minerals-folic acid (CENTRUM) chewable tablet Chew 1 tablet by mouth daily.       valACYclovir (VALTREX) 1000 MG tablet Take 1,000 mg by mouth as needed.       Allergies  Allergen Reactions   Iodine Anaphylaxis    Contrast dye   Morphine And Related Nausea And Vomiting   Shellfish Allergy Swelling    Lips swelling and wheezing   Tylenol [Acetaminophen]     Causes elevated liver enzymes    Vitals:   07/05/22 1005 07/10/22 1359  Weight:  77.1 kg  Height: '5\' 4"'$  (1.626 m)     Lungs: clear to ascultation Cor:  RRR Abdomen:  soft, nontender, nondistended. Ex:  no cords, erythema Pelvic:   Vulva: no masses, no atrophy, no lesions Vagina: no tenderness, no erythema, no abnormal vaginal discharge, no vesicle(s) or ulcers, no cystocele, no rectocele Cervix: grossly normal, no discharge, no cervical motion tenderness Uterus: normal size (7), normal shape, midline, no uterine prolapse, non-tender Bladder/Urethra: normal meatus, no urethral discharge, no urethral mass, bladder non distended, Urethra well supported Adnexa/Parametria: no parametrial tenderness, no parametrial mass, no adnexal tenderness, no ovarian mass   A:  For robotic TLH, bilateral salpingectomies, cysto, possible resection of endometriosis.    P: P: All risks, benefits and alternatives d/w patient and she desires to proceed.  Patient has undergone a modified diet, ERAS protocol and will receive  preop antibiotics and SCDs during the operation.   Pt to have extended recovery but will go home same day if eating, ambulating, voiding and pain control is good.  Daria Pastures

## 2022-07-18 NOTE — Progress Notes (Signed)
Patient called and said she had the coronary CT done this morning. She was told that the results would most likely be available in her portal by the end of the day. Her PCP, Alex Gardener, PA will not give clearance for surgery until he sees the results of the Coronary CTA and recent labwork. Patient did not want to have an EKG or have a cbc and cmp done at her pre-op lab appt yesterday because of a recent cbc and cmp. I was able to get those results faxed from Dr. Teodoro Spray office. She didn't want the EKG done because she said they were going to do  EKG monitoring when she had the coronary CT. I explained that this was not the same type of EKG and advised that patient have the EKG done at her pre-op appt. She declined to have EKG at that time. I have put in an order for an EKG on the day of surgery. Recent labwork is in the patient's chart for surgery. Awaiting results of coronary CTA and cardiac clearance.

## 2022-07-18 NOTE — Progress Notes (Signed)
Patient called and requested that I fax her 07/11/22 lab work and Coronary CTA to Dr. Philis Pique. I faxed the documents to Dr. Philis Pique and received confirmation that fax went through.  I also  faxed lab work to [employer], [place]. I received confirmations that the fax went through.  Patient made aware that faxes were sent.

## 2022-07-18 NOTE — Progress Notes (Signed)
Received note from Alex Gardener, [place] via fax that patient is medically cleared for surgery on 07/19/22. Clearance placed in chart.

## 2022-07-19 ENCOUNTER — Ambulatory Visit (HOSPITAL_BASED_OUTPATIENT_CLINIC_OR_DEPARTMENT_OTHER): Payer: Self-pay | Admitting: Anesthesiology

## 2022-07-19 ENCOUNTER — Other Ambulatory Visit: Payer: Self-pay

## 2022-07-19 ENCOUNTER — Encounter (HOSPITAL_BASED_OUTPATIENT_CLINIC_OR_DEPARTMENT_OTHER): Payer: Self-pay | Admitting: Obstetrics and Gynecology

## 2022-07-19 ENCOUNTER — Encounter (HOSPITAL_BASED_OUTPATIENT_CLINIC_OR_DEPARTMENT_OTHER)
Admission: RE | Disposition: A | Discharge status: Home / Self Care | Payer: Self-pay | Source: Home / Self Care | Attending: Obstetrics and Gynecology

## 2022-07-19 ENCOUNTER — Ambulatory Visit (HOSPITAL_BASED_OUTPATIENT_CLINIC_OR_DEPARTMENT_OTHER)
Admission: RE | Admit: 2022-07-19 | Discharge: 2022-07-20 | Disposition: A | Discharge status: Home / Self Care | Payer: Self-pay | Attending: Obstetrics and Gynecology | Admitting: Obstetrics and Gynecology

## 2022-07-19 DIAGNOSIS — K589 Irritable bowel syndrome without diarrhea: Secondary | ICD-10-CM | POA: Insufficient documentation

## 2022-07-19 DIAGNOSIS — Z79899 Other long term (current) drug therapy: Secondary | ICD-10-CM | POA: Insufficient documentation

## 2022-07-19 DIAGNOSIS — Z01818 Encounter for other preprocedural examination: Secondary | ICD-10-CM

## 2022-07-19 DIAGNOSIS — M329 Systemic lupus erythematosus, unspecified: Secondary | ICD-10-CM | POA: Insufficient documentation

## 2022-07-19 DIAGNOSIS — M797 Fibromyalgia: Secondary | ICD-10-CM | POA: Insufficient documentation

## 2022-07-19 DIAGNOSIS — M1990 Unspecified osteoarthritis, unspecified site: Secondary | ICD-10-CM | POA: Insufficient documentation

## 2022-07-19 DIAGNOSIS — J45909 Unspecified asthma, uncomplicated: Secondary | ICD-10-CM | POA: Insufficient documentation

## 2022-07-19 DIAGNOSIS — Z9889 Other specified postprocedural states: Secondary | ICD-10-CM

## 2022-07-19 DIAGNOSIS — Z791 Long term (current) use of non-steroidal anti-inflammatories (NSAID): Secondary | ICD-10-CM | POA: Insufficient documentation

## 2022-07-19 DIAGNOSIS — Z79890 Hormone replacement therapy (postmenopausal): Secondary | ICD-10-CM | POA: Insufficient documentation

## 2022-07-19 DIAGNOSIS — D259 Leiomyoma of uterus, unspecified: Secondary | ICD-10-CM | POA: Insufficient documentation

## 2022-07-19 DIAGNOSIS — Z7952 Long term (current) use of systemic steroids: Secondary | ICD-10-CM | POA: Insufficient documentation

## 2022-07-19 DIAGNOSIS — F418 Other specified anxiety disorders: Secondary | ICD-10-CM | POA: Insufficient documentation

## 2022-07-19 DIAGNOSIS — Z90722 Acquired absence of ovaries, bilateral: Secondary | ICD-10-CM | POA: Insufficient documentation

## 2022-07-19 DIAGNOSIS — E039 Hypothyroidism, unspecified: Secondary | ICD-10-CM

## 2022-07-19 DIAGNOSIS — I2510 Atherosclerotic heart disease of native coronary artery without angina pectoris: Secondary | ICD-10-CM | POA: Insufficient documentation

## 2022-07-19 DIAGNOSIS — I341 Nonrheumatic mitral (valve) prolapse: Secondary | ICD-10-CM | POA: Insufficient documentation

## 2022-07-19 DIAGNOSIS — I10 Essential (primary) hypertension: Secondary | ICD-10-CM

## 2022-07-19 DIAGNOSIS — K219 Gastro-esophageal reflux disease without esophagitis: Secondary | ICD-10-CM | POA: Insufficient documentation

## 2022-07-19 DIAGNOSIS — N888 Other specified noninflammatory disorders of cervix uteri: Secondary | ICD-10-CM | POA: Insufficient documentation

## 2022-07-19 HISTORY — DX: Polyneuropathy, unspecified: G62.9

## 2022-07-19 HISTORY — PX: CYSTOSCOPY: SHX5120

## 2022-07-19 HISTORY — DX: Headache, unspecified: R51.9

## 2022-07-19 HISTORY — PX: ROBOTIC ASSISTED TOTAL HYSTERECTOMY WITH BILATERAL SALPINGO OOPHERECTOMY: SHX6086

## 2022-07-19 HISTORY — DX: Palpitations: R00.2

## 2022-07-19 LAB — ABO/RH: ABO/RH(D): A POS

## 2022-07-19 LAB — TYPE AND SCREEN
ABO/RH(D): A POS
Antibody Screen: NEGATIVE

## 2022-07-19 LAB — POCT PREGNANCY, URINE: Preg Test, Ur: NEGATIVE

## 2022-07-19 SURGERY — HYSTERECTOMY, TOTAL, ROBOT-ASSISTED, LAPAROSCOPIC, WITH BILATERAL SALPINGO-OOPHORECTOMY
Anesthesia: General | Site: Bladder

## 2022-07-19 MED ORDER — LACTATED RINGERS IV SOLN: INTRAVENOUS | Status: DC

## 2022-07-19 MED ORDER — LIDOCAINE HCL (PF) 2 % IJ SOLN
INTRAMUSCULAR | Status: DC | PRN
  Administered 2022-07-19: 1.5 mg/kg/h via INTRADERMAL

## 2022-07-19 MED ORDER — IBUPROFEN 800 MG PO TABS
800.0000 mg | ORAL_TABLET | Freq: Three times a day (TID) | ORAL | Status: DC
  Administered 2022-07-19 – 2022-07-20 (×2): 800 mg via ORAL

## 2022-07-19 MED ORDER — IBUPROFEN 800 MG PO TABS
ORAL_TABLET | ORAL | Status: AC
  Filled 2022-07-19: qty 1

## 2022-07-19 MED ORDER — SUGAMMADEX SODIUM 200 MG/2ML IV SOLN
INTRAVENOUS | Status: DC | PRN
  Administered 2022-07-19: 200 mg via INTRAVENOUS

## 2022-07-19 MED ORDER — PSEUDOEPHEDRINE HCL ER 120 MG PO TB12: 120.0000 mg | ORAL_TABLET | Freq: Two times a day (BID) | ORAL | Status: DC

## 2022-07-19 MED ORDER — SOD CITRATE-CITRIC ACID 500-334 MG/5ML PO SOLN: 30.0000 mL | ORAL | Status: DC

## 2022-07-19 MED ORDER — HYDROCHLOROTHIAZIDE 25 MG PO TABS
25.0000 mg | ORAL_TABLET | Freq: Every evening | ORAL | Status: DC
  Administered 2022-07-20: 25 mg via ORAL
  Filled 2022-07-19: qty 1

## 2022-07-19 MED ORDER — KETOROLAC TROMETHAMINE 15 MG/ML IJ SOLN: 15.0000 mg | INTRAMUSCULAR | Status: DC

## 2022-07-19 MED ORDER — FENTANYL CITRATE (PF) 100 MCG/2ML IJ SOLN
25.0000 ug | INTRAMUSCULAR | Status: DC | PRN
  Administered 2022-07-19 (×2): 50 ug via INTRAVENOUS

## 2022-07-19 MED ORDER — DICYCLOMINE HCL 20 MG PO TABS: 20.0000 mg | ORAL_TABLET | ORAL | Status: DC | PRN

## 2022-07-19 MED ORDER — PROMETHAZINE HCL 25 MG/ML IJ SOLN: 6.2500 mg | INTRAMUSCULAR | Status: DC | PRN

## 2022-07-19 MED ORDER — FAMOTIDINE 20 MG PO TABS
20.0000 mg | ORAL_TABLET | Freq: Two times a day (BID) | ORAL | Status: DC
  Administered 2022-07-19: 20 mg via ORAL

## 2022-07-19 MED ORDER — HYDROMORPHONE HCL 1 MG/ML IJ SOLN
1.0000 mg | INTRAMUSCULAR | Status: DC
  Administered 2022-07-19 (×2): 0.5 mg via INTRAVENOUS

## 2022-07-19 MED ORDER — CENTRUM PO CHEW: 1.0000 | CHEWABLE_TABLET | Freq: Every day | ORAL | Status: DC

## 2022-07-19 MED ORDER — PROPOFOL 10 MG/ML IV BOLUS
INTRAVENOUS | Status: DC | PRN
  Administered 2022-07-19: 180 mg via INTRAVENOUS

## 2022-07-19 MED ORDER — PROMETHAZINE HCL 25 MG/ML IJ SOLN: 25.0000 mg | INTRAMUSCULAR | Status: DC | PRN

## 2022-07-19 MED ORDER — SIMETHICONE 80 MG PO CHEW: 80.0000 mg | CHEWABLE_TABLET | Freq: Four times a day (QID) | ORAL | Status: DC | PRN

## 2022-07-19 MED ORDER — LORATADINE 10 MG PO TABS
ORAL_TABLET | ORAL | Status: AC
  Filled 2022-07-19: qty 1

## 2022-07-19 MED ORDER — SODIUM CHLORIDE 0.9 % IV SOLN
25.0000 mg | INTRAVENOUS | Status: DC | PRN
  Administered 2022-07-19: 25 mg via INTRAVENOUS
  Filled 2022-07-19: qty 25

## 2022-07-19 MED ORDER — HYDROMORPHONE HCL 1 MG/ML IJ SOLN
INTRAMUSCULAR | Status: AC
  Filled 2022-07-19: qty 1

## 2022-07-19 MED ORDER — SCOPOLAMINE 1 MG/3DAYS TD PT72
MEDICATED_PATCH | TRANSDERMAL | Status: AC
  Filled 2022-07-19: qty 1

## 2022-07-19 MED ORDER — PROPOFOL 10 MG/ML IV BOLUS
INTRAVENOUS | Status: AC
  Filled 2022-07-19: qty 20

## 2022-07-19 MED ORDER — DIAZEPAM 5 MG PO TABS
10.0000 mg | ORAL_TABLET | Freq: Four times a day (QID) | ORAL | Status: DC | PRN
  Administered 2022-07-19: 10 mg via ORAL

## 2022-07-19 MED ORDER — FUROSEMIDE 10 MG/ML IJ SOLN
INTRAMUSCULAR | Status: DC | PRN
  Administered 2022-07-19: 20 mg via INTRAMUSCULAR

## 2022-07-19 MED ORDER — OXYCODONE HCL 5 MG/5ML PO SOLN: 5.0000 mg | Freq: Once | ORAL | Status: DC | PRN

## 2022-07-19 MED ORDER — DEXAMETHASONE SODIUM PHOSPHATE 10 MG/ML IJ SOLN
INTRAMUSCULAR | Status: DC | PRN
  Administered 2022-07-19: 5 mg via INTRAVENOUS

## 2022-07-19 MED ORDER — ROCURONIUM BROMIDE 10 MG/ML (PF) SYRINGE
PREFILLED_SYRINGE | INTRAVENOUS | Status: DC | PRN
  Administered 2022-07-19: 10 mg via INTRAVENOUS
  Administered 2022-07-19: 20 mg via INTRAVENOUS
  Administered 2022-07-19: 50 mg via INTRAVENOUS
  Administered 2022-07-19: 20 mg via INTRAVENOUS

## 2022-07-19 MED ORDER — CEFAZOLIN SODIUM-DEXTROSE 2-4 GM/100ML-% IV SOLN
INTRAVENOUS | Status: AC
  Filled 2022-07-19: qty 100

## 2022-07-19 MED ORDER — PREDNISONE 10 MG PO TABS: 10.0000 mg | ORAL_TABLET | Freq: Two times a day (BID) | ORAL | Status: DC

## 2022-07-19 MED ORDER — CELECOXIB 200 MG PO CAPS: 400.0000 mg | ORAL_CAPSULE | ORAL | Status: DC

## 2022-07-19 MED ORDER — CELECOXIB 200 MG PO CAPS
ORAL_CAPSULE | ORAL | Status: AC
  Filled 2022-07-19: qty 1

## 2022-07-19 MED ORDER — OXYCODONE HCL 5 MG PO TABS
5.0000 mg | ORAL_TABLET | ORAL | Status: DC | PRN
  Administered 2022-07-19 – 2022-07-20 (×3): 10 mg via ORAL

## 2022-07-19 MED ORDER — FENTANYL CITRATE (PF) 100 MCG/2ML IJ SOLN
INTRAMUSCULAR | Status: AC
  Filled 2022-07-19: qty 2

## 2022-07-19 MED ORDER — ONDANSETRON HCL 4 MG/2ML IJ SOLN
INTRAMUSCULAR | Status: DC | PRN
  Administered 2022-07-19: 4 mg via INTRAVENOUS

## 2022-07-19 MED ORDER — SENNOSIDES-DOCUSATE SODIUM 8.6-50 MG PO TABS: 1.0000 | ORAL_TABLET | Freq: Every evening | ORAL | Status: DC | PRN

## 2022-07-19 MED ORDER — MIDAZOLAM HCL 2 MG/2ML IJ SOLN
INTRAMUSCULAR | Status: AC
  Filled 2022-07-19: qty 2

## 2022-07-19 MED ORDER — ENOXAPARIN SODIUM 40 MG/0.4ML IJ SOSY
40.0000 mg | PREFILLED_SYRINGE | INTRAMUSCULAR | Status: DC
  Administered 2022-07-20: 40 mg via SUBCUTANEOUS

## 2022-07-19 MED ORDER — SODIUM CHLORIDE 0.9 % IR SOLN
Status: DC | PRN
  Administered 2022-07-19: 600 mL

## 2022-07-19 MED ORDER — VALACYCLOVIR HCL 500 MG PO TABS: 1000.0000 mg | ORAL_TABLET | ORAL | Status: DC | PRN

## 2022-07-19 MED ORDER — ONDANSETRON HCL 4 MG/2ML IJ SOLN
INTRAMUSCULAR | Status: AC
  Filled 2022-07-19: qty 2

## 2022-07-19 MED ORDER — BUPROPION HCL ER (XL) 300 MG PO TB24
300.0000 mg | ORAL_TABLET | Freq: Every day | ORAL | Status: DC
  Administered 2022-07-20: 300 mg via ORAL
  Filled 2022-07-19: qty 1

## 2022-07-19 MED ORDER — DIAZEPAM 5 MG PO TABS
ORAL_TABLET | ORAL | Status: AC
  Filled 2022-07-19: qty 2

## 2022-07-19 MED ORDER — FAMOTIDINE 20 MG PO TABS
ORAL_TABLET | ORAL | Status: AC
  Filled 2022-07-19: qty 1

## 2022-07-19 MED ORDER — DEXAMETHASONE SODIUM PHOSPHATE 10 MG/ML IJ SOLN
INTRAMUSCULAR | Status: AC
  Filled 2022-07-19: qty 1

## 2022-07-19 MED ORDER — LIDOCAINE 2% (20 MG/ML) 5 ML SYRINGE
INTRAMUSCULAR | Status: DC | PRN
  Administered 2022-07-19: 60 mg via INTRAVENOUS

## 2022-07-19 MED ORDER — SODIUM CHLORIDE 0.9 % IV SOLN
INTRAVENOUS | Status: DC | PRN
  Administered 2022-07-19: 60 mL

## 2022-07-19 MED ORDER — KETAMINE HCL 10 MG/ML IJ SOLN
INTRAMUSCULAR | Status: DC | PRN
  Administered 2022-07-19 (×2): 10 mg via INTRAVENOUS
  Administered 2022-07-19: 30 mg via INTRAVENOUS

## 2022-07-19 MED ORDER — SODIUM CHLORIDE 0.9 % IR SOLN
Status: DC | PRN
  Administered 2022-07-19: 1000 mL via INTRAVESICAL

## 2022-07-19 MED ORDER — GABAPENTIN 300 MG PO CAPS
300.0000 mg | ORAL_CAPSULE | ORAL | Status: AC
  Administered 2022-07-19: 300 mg via ORAL

## 2022-07-19 MED ORDER — CYCLOBENZAPRINE HCL 10 MG PO TABS
10.0000 mg | ORAL_TABLET | Freq: Three times a day (TID) | ORAL | Status: DC | PRN
Start: 2022-07-19 — End: 2022-07-20
  Administered 2022-07-19: 10 mg via ORAL
  Filled 2022-07-19: qty 1

## 2022-07-19 MED ORDER — FLUORESCEIN SODIUM 10 % IV SOLN
INTRAVENOUS | Status: DC | PRN
  Administered 2022-07-19: 100 mg via INTRAVENOUS

## 2022-07-19 MED ORDER — LORATADINE 10 MG PO TABS
10.0000 mg | ORAL_TABLET | Freq: Every day | ORAL | Status: DC
  Administered 2022-07-19: 10 mg via ORAL

## 2022-07-19 MED ORDER — LEVOTHYROXINE SODIUM 50 MCG PO TABS
50.0000 ug | ORAL_TABLET | ORAL | Status: DC
  Administered 2022-07-20: 50 ug via ORAL
  Filled 2022-07-19: qty 1

## 2022-07-19 MED ORDER — OXYCODONE HCL 5 MG PO TABS: 5.0000 mg | ORAL_TABLET | Freq: Once | ORAL | Status: DC | PRN

## 2022-07-19 MED ORDER — KETAMINE HCL 50 MG/5ML IJ SOSY
PREFILLED_SYRINGE | INTRAMUSCULAR | Status: AC
  Filled 2022-07-19: qty 5

## 2022-07-19 MED ORDER — MONTELUKAST SODIUM 10 MG PO TABS
10.0000 mg | ORAL_TABLET | Freq: Every day | ORAL | Status: DC
  Administered 2022-07-19: 10 mg via ORAL
  Filled 2022-07-19: qty 1

## 2022-07-19 MED ORDER — METHOTREXATE 2.5 MG PO TABS: 2.5000 mg | ORAL_TABLET | ORAL | Status: DC

## 2022-07-19 MED ORDER — FENTANYL CITRATE (PF) 100 MCG/2ML IJ SOLN
INTRAMUSCULAR | Status: DC | PRN
  Administered 2022-07-19: 100 ug via INTRAVENOUS

## 2022-07-19 MED ORDER — FLUTICASONE PROPIONATE 50 MCG/ACT NA SUSP
1.0000 | Freq: Every day | NASAL | Status: DC
  Administered 2022-07-19: 1 via NASAL
  Filled 2022-07-19: qty 16

## 2022-07-19 MED ORDER — HYDROMORPHONE HCL 1 MG/ML IJ SOLN
1.0000 mg | INTRAMUSCULAR | Status: AC | PRN
  Administered 2022-07-19: 1 mg via INTRAVENOUS

## 2022-07-19 MED ORDER — DOXEPIN HCL 10 MG PO CAPS: 10.0000 mg | ORAL_CAPSULE | Freq: Every evening | ORAL | Status: DC | PRN

## 2022-07-19 MED ORDER — ONDANSETRON HCL 4 MG PO TABS: 4.0000 mg | ORAL_TABLET | Freq: Four times a day (QID) | ORAL | Status: DC | PRN

## 2022-07-19 MED ORDER — CELECOXIB 200 MG PO CAPS
200.0000 mg | ORAL_CAPSULE | Freq: Once | ORAL | Status: AC
Start: 2022-07-19 — End: 2022-07-19
  Administered 2022-07-19: 200 mg via ORAL

## 2022-07-19 MED ORDER — 0.9 % SODIUM CHLORIDE (POUR BTL) OPTIME
TOPICAL | Status: DC | PRN
  Administered 2022-07-19: 500 mL

## 2022-07-19 MED ORDER — ONDANSETRON HCL 4 MG/2ML IJ SOLN
4.0000 mg | Freq: Four times a day (QID) | INTRAMUSCULAR | Status: DC | PRN
  Administered 2022-07-19: 4 mg via INTRAVENOUS

## 2022-07-19 MED ORDER — ROCURONIUM BROMIDE 10 MG/ML (PF) SYRINGE
PREFILLED_SYRINGE | INTRAVENOUS | Status: AC
  Filled 2022-07-19: qty 10

## 2022-07-19 MED ORDER — HEMOSTATIC AGENTS (NO CHARGE) OPTIME
TOPICAL | Status: DC | PRN
  Administered 2022-07-19: 1

## 2022-07-19 MED ORDER — LOSARTAN POTASSIUM 50 MG PO TABS
50.0000 mg | ORAL_TABLET | Freq: Every day | ORAL | Status: DC
  Administered 2022-07-19: 50 mg via ORAL
  Filled 2022-07-19: qty 1

## 2022-07-19 MED ORDER — MIDAZOLAM HCL 2 MG/2ML IJ SOLN
INTRAMUSCULAR | Status: DC | PRN
  Administered 2022-07-19: 2 mg via INTRAVENOUS

## 2022-07-19 MED ORDER — OXYCODONE HCL 5 MG PO TABS
ORAL_TABLET | ORAL | Status: AC
  Filled 2022-07-19: qty 2

## 2022-07-19 MED ORDER — FUROSEMIDE 10 MG/ML IJ SOLN
INTRAMUSCULAR | Status: AC
  Filled 2022-07-19: qty 2

## 2022-07-19 MED ORDER — MENTHOL 3 MG MT LOZG: 1.0000 | LOZENGE | OROMUCOSAL | Status: DC | PRN

## 2022-07-19 MED ORDER — LORATADINE 10 MG PO TABS: 10.0000 mg | ORAL_TABLET | Freq: Every day | ORAL | Status: DC

## 2022-07-19 MED ORDER — LIDOCAINE HCL (PF) 2 % IJ SOLN
INTRAMUSCULAR | Status: AC
  Filled 2022-07-19: qty 10

## 2022-07-19 MED ORDER — SCOPOLAMINE 1 MG/3DAYS TD PT72
1.0000 | MEDICATED_PATCH | TRANSDERMAL | Status: DC
  Administered 2022-07-19: 1.5 mg via TRANSDERMAL

## 2022-07-19 MED ORDER — CEFAZOLIN SODIUM-DEXTROSE 2-4 GM/100ML-% IV SOLN
2.0000 g | INTRAVENOUS | Status: AC
  Administered 2022-07-19: 2 g via INTRAVENOUS

## 2022-07-19 MED ORDER — GABAPENTIN 300 MG PO CAPS
ORAL_CAPSULE | ORAL | Status: AC
  Filled 2022-07-19: qty 1

## 2022-07-19 SURGICAL SUPPLY — 70 items
ADH SKN CLS APL DERMABOND .7 (GAUZE/BANDAGES/DRESSINGS) ×4
APL PRP STRL LF DISP 70% ISPRP (MISCELLANEOUS) ×2
APL SRG 38 LTWT LNG FL B (MISCELLANEOUS) ×2
APPLICATOR ARISTA FLEXITIP XL (MISCELLANEOUS) IMPLANT
BARRIER ADHS 3X4 INTERCEED (GAUZE/BANDAGES/DRESSINGS) IMPLANT
BRR ADH 4X3 ABS CNTRL BYND (GAUZE/BANDAGES/DRESSINGS)
CHLORAPREP W/TINT 26 (MISCELLANEOUS) IMPLANT
COVER BACK TABLE 60X90IN (DRAPES) ×3 IMPLANT
COVER TIP SHEARS 8 DVNC (MISCELLANEOUS) ×3 IMPLANT
COVER TIP SHEARS 8MM DA VINCI (MISCELLANEOUS) ×2
DECANTER SPIKE VIAL GLASS SM (MISCELLANEOUS) ×3 IMPLANT
DEFOGGER SCOPE WARMER CLEARIFY (MISCELLANEOUS) ×3 IMPLANT
DERMABOND ADVANCED (GAUZE/BANDAGES/DRESSINGS) ×4
DERMABOND ADVANCED .7 DNX12 (GAUZE/BANDAGES/DRESSINGS) ×3 IMPLANT
DRAPE ARM DVNC X/XI (DISPOSABLE) ×12 IMPLANT
DRAPE COLUMN DVNC XI (DISPOSABLE) ×3 IMPLANT
DRAPE DA VINCI XI ARM (DISPOSABLE) ×8
DRAPE DA VINCI XI COLUMN (DISPOSABLE) ×2
DRAPE ROBOTICS STRL (DRAPES) ×3 IMPLANT
DRAPE UTILITY XL STRL (DRAPES) ×3 IMPLANT
DURAPREP 26ML APPLICATOR (WOUND CARE) ×3 IMPLANT
ELECT REM PT RETURN 9FT ADLT (ELECTROSURGICAL) ×2
ELECTRODE REM PT RTRN 9FT ADLT (ELECTROSURGICAL) ×3 IMPLANT
GAUZE 4X4 16PLY ~~LOC~~+RFID DBL (SPONGE) ×6 IMPLANT
GLOVE BIO SURGEON STRL SZ 6 (GLOVE) IMPLANT
GLOVE BIO SURGEON STRL SZ7 (GLOVE) ×12 IMPLANT
GLOVE BIOGEL PI IND STRL 6 (GLOVE) IMPLANT
GLOVE BIOGEL PI IND STRL 8 (GLOVE) ×3 IMPLANT
GLOVE BIOGEL PI INDICATOR 6 (GLOVE)
GLOVE BIOGEL PI INDICATOR 8 (GLOVE) ×2
HEMOSTAT ARISTA ABSORB 3G PWDR (HEMOSTASIS) IMPLANT
HOLDER FOLEY CATH W/STRAP (MISCELLANEOUS) IMPLANT
IRRIG SUCT STRYKERFLOW 2 WTIP (MISCELLANEOUS) ×2
IRRIGATION SUCT STRKRFLW 2 WTP (MISCELLANEOUS) ×3 IMPLANT
IV NS 1000ML (IV SOLUTION) ×4
IV NS 1000ML BAXH (IV SOLUTION) IMPLANT
KIT TURNOVER CYSTO (KITS) ×3 IMPLANT
LEGGING LITHOTOMY PAIR STRL (DRAPES) ×3 IMPLANT
MANIPULATOR ADVINCU DEL 2.5 PL (MISCELLANEOUS) IMPLANT
MANIPULATOR ADVINCU DEL 3.0 PL (MISCELLANEOUS) IMPLANT
MANIPULATOR ADVINCU DEL 3.5 PL (MISCELLANEOUS) IMPLANT
MANIPULATOR ADVINCU DEL 4.0 PL (MISCELLANEOUS) IMPLANT
NDL INSUFFLATION 14GA 120MM (NEEDLE) ×3 IMPLANT
NEEDLE INSUFFLATION 14GA 120MM (NEEDLE) ×2 IMPLANT
NS IRRIG 500ML POUR BTL (IV SOLUTION) IMPLANT
OBTURATOR OPTICAL STANDARD 8MM (TROCAR) ×2
OBTURATOR OPTICAL STND 8 DVNC (TROCAR) ×2
OBTURATOR OPTICALSTD 8 DVNC (TROCAR) ×3 IMPLANT
PACK ROBOT WH (CUSTOM PROCEDURE TRAY) ×3 IMPLANT
PACK ROBOTIC GOWN (GOWN DISPOSABLE) ×3 IMPLANT
PACK TRENDGUARD 450 HYBRID PRO (MISCELLANEOUS) IMPLANT
PAD OB MATERNITY 4.3X12.25 (PERSONAL CARE ITEMS) ×3 IMPLANT
PAD PREP 24X48 CUFFED NSTRL (MISCELLANEOUS) ×3 IMPLANT
POUCH LAPAROSCOPIC INSTRUMENT (MISCELLANEOUS) IMPLANT
PROTECTOR NERVE ULNAR (MISCELLANEOUS) ×6 IMPLANT
SEAL CANN UNIV 5-8 DVNC XI (MISCELLANEOUS) ×9 IMPLANT
SEAL XI 5MM-8MM UNIVERSAL (MISCELLANEOUS) ×6
SET IRRIG Y TYPE TUR BLADDER L (SET/KITS/TRAYS/PACK) ×3 IMPLANT
SET TRI-LUMEN FLTR TB AIRSEAL (TUBING) ×3 IMPLANT
SPONGE T-LAP 4X18 ~~LOC~~+RFID (SPONGE) ×3 IMPLANT
SUT VIC AB 0 CT1 36 (SUTURE) ×3 IMPLANT
SUT VICRYL RAPIDE 3 0 (SUTURE) ×6 IMPLANT
SUT VLOC 180 0 9IN  GS21 (SUTURE) ×2
SUT VLOC 180 0 9IN GS21 (SUTURE) ×3 IMPLANT
TOWEL OR 17X26 10 PK STRL BLUE (TOWEL DISPOSABLE) ×3 IMPLANT
TRAY FOLEY W/BAG SLVR 14FR LF (SET/KITS/TRAYS/PACK) ×3 IMPLANT
TRENDGUARD 450 HYBRID PRO PACK (MISCELLANEOUS) ×2
TROCAR PORT AIRSEAL 5X120 (TROCAR) ×3 IMPLANT
TROCAR Z-THREAD FIOS 5X100MM (TROCAR) IMPLANT
WATER STERILE IRR 1000ML POUR (IV SOLUTION) ×3 IMPLANT

## 2022-07-19 NOTE — Discharge Summary (Signed)
Physician Discharge Summary  Patient ID: KORTNEY POTVIN MRN: [id number] DOB/AGE: [birth year] 46 y.o.  Admit date: 07/19/2022 Discharge date: 07/20/2022  Admission Diagnoses:Severe dysmenorrhea, endometriosis  Discharge Diagnoses: same Principal Problem:   Postoperative state Active Problems:   Post-operative state   Discharged Condition: good  Hospital Course: Uncomplicated robotic TLH, bilateral salping-oopherectomies    Consults: None  Significant Diagnostic Studies: none  Treatments: surgery: TLH, bilateral salping-oopherectomies    Discharge Exam: Blood pressure 139/82, pulse 87, temperature 98.2 F (36.8 C), resp. rate 18, height '5\' 4"'$  (1.626 m), weight 77.2 kg, SpO2 99 %.   Disposition: Discharge disposition: 01-Home or Self Care       Discharge Instructions     Call MD for:  temperature >100.4   Complete by: As directed    Diet - low sodium heart healthy   Complete by: As directed    Discharge instructions   Complete by: As directed    No driving on narcotics, no sexual activity for 2 weeks.   Increase activity slowly   Complete by: As directed    May shower / Bathe   Complete by: As directed    Shower, no bath for 2 weeks.   Remove dressing in 24 hours   Complete by: As directed    Sexual Activity Restrictions   Complete by: As directed    No sexual activity for 2 weeks.      Allergies as of 07/20/2022       Reactions   Iodine Anaphylaxis   Contrast dye   Morphine And Related Nausea And Vomiting   Shellfish Allergy Swelling   Lips swelling and wheezing        Medication List     STOP taking these medications    celecoxib 200 MG capsule Commonly known as: CELEBREX       TAKE these medications    buPROPion 300 MG 24 hr tablet Commonly known as: WELLBUTRIN XL Take 300 mg by mouth daily.   cyclobenzaprine 10 MG tablet Commonly known as: FLEXERIL Take 10 mg by mouth 3 (three) times daily as needed for muscle spasms.   diazepam  10 MG tablet Commonly known as: VALIUM Take 10 mg by mouth every 6 (six) hours as needed for anxiety.   dicyclomine 20 MG tablet Commonly known as: BENTYL Take 20 mg by mouth as needed for spasms.   doxepin 10 MG capsule Commonly known as: SINEQUAN Take 10 mg by mouth at bedtime as needed.   famotidine 20 MG tablet Commonly known as: PEPCID Take 20 mg by mouth 2 (two) times daily.   fexofenadine 60 MG tablet Commonly known as: ALLEGRA Take 60 mg by mouth 2 (two) times daily.   fluticasone 50 MCG/ACT nasal spray Commonly known as: FLONASE Place into both nostrils daily.   folic acid 1 MG tablet Commonly known as: FOLVITE Take 1 mg by mouth daily. Takes 2 tablet daily   furosemide 20 MG tablet Commonly known as: LASIX Take 20 mg by mouth as needed.   hydrochlorothiazide 25 MG tablet Commonly known as: HYDRODIURIL Take 25 mg by mouth every evening.   ibuprofen 800 MG tablet Commonly known as: ADVIL Take 1 tablet (800 mg total) by mouth 3 (three) times daily.   levothyroxine 50 MCG tablet Commonly known as: SYNTHROID Take 50 mcg by mouth every morning.   losartan 50 MG tablet Commonly known as: COZAAR Take 50 mg by mouth daily.   methotrexate 2.5 MG tablet Commonly known as: RHEUMATREX  Take 2.5 mg by mouth once a week. Caution:Chemotherapy. Protect from light. Currently taking 6 tablets of 2.5 mg one day per week as of 07/10/22.   montelukast 10 MG tablet Commonly known as: SINGULAIR Take 10 mg by mouth at bedtime.   multivitamin-iron-minerals-folic acid chewable tablet Chew 1 tablet by mouth daily.   norethindrone 0.35 MG tablet Commonly known as: MICRONOR Take 1 tablet by mouth daily.   oxyCODONE 5 MG immediate release tablet Commonly known as: Oxy IR/ROXICODONE Take 1-2 tablets (5-10 mg total) by mouth every 4 (four) hours as needed for moderate pain.   predniSONE 10 MG tablet Commonly known as: DELTASONE Take 10 mg by mouth in the morning and at  bedtime. With taper   valACYclovir 1000 MG tablet Commonly known as: VALTREX Take 1,000 mg by mouth as needed.        Follow-up Information     Bobbye Charleston, MD Follow up in 2 week(s).   Specialty: Obstetrics and Gynecology Contact information: [address] [place] [address] [phone]                 Signed: Daria Pastures 07/20/2022, 9:28 AM

## 2022-07-19 NOTE — Transfer of Care (Signed)
Immediate Anesthesia Transfer of Care Note  Patient: Patricia Griffin  Procedure(s) Performed: Procedure(s) (LRB): XI ROBOTIC ASSISTED TOTAL HYSTERECTOMY WITH BILATERAL SALPINGO-OOPHARECTOMY (N/A) CYSTOSCOPY (N/A)  Patient Location: PACU  Anesthesia Type: General  Level of Consciousness: awake, oriented, sedated and patient cooperative  Airway & Oxygen Therapy: Patient Spontanous Breathing and Patient connected to face mask oxygen  Post-op Assessment: Report given to PACU RN and Post -op Vital signs reviewed and stable  Post vital signs: Reviewed and stable  Complications: No apparent anesthesia complications Last Vitals:  Vitals Value Taken Time  BP 114/67 07/19/22 1600  Temp 37.2 C 07/19/22 1558  Pulse 74 07/19/22 1602  Resp 15 07/19/22 1602  SpO2 97 % 07/19/22 1602  Vitals shown include unvalidated device data.  Last Pain:  Vitals:   07/19/22 1207  TempSrc: Oral         Complications: No notable events documented.

## 2022-07-19 NOTE — Op Note (Signed)
07/19/2022  3:41 PM  PATIENT:  Patricia Griffin  46 y.o. female  PRE-OPERATIVE DIAGNOSIS:  uterine leiomyoma  POST-OPERATIVE DIAGNOSIS:  uterine leiomyoma  PROCEDURE:  Procedure(s): XI ROBOTIC ASSISTED TOTAL HYSTERECTOMY WITH BILATERAL SALPINGO-OOPHARECTOMY (N/A) CYSTOSCOPY (N/A)  SURGEON:  Surgeon(s) and Role:    * Bobbye Charleston, MD - Primary    * Rowland Lathe, MD - Assisting  ANESTHESIA:   general  EBL:  100 mL   LOCAL MEDICATIONS USED:  OTHER Ropivicaine, ARista  SPECIMEN:  Source of Specimen:  Uterus, bilateral tubes and ovaries, cervix.   DISPOSITION OF SPECIMEN:  PATHOLOGY  COUNTS:  YES  TOURNIQUET:  * No tourniquets in log *  DICTATION: .Note written in [place]: Admit for overnight observation  PATIENT DISPOSITION:  PACU - hemodynamically stable.   Delay start of Pharmacological VTE agent (>24hrs) due to surgical blood loss or risk of bleeding: not applicable  Complications:  None.  Findings: 12 weeks size uterus with large anterior fibroid.  Ovaries were adhesed to posterior fundus and bowel with implants of endometriosis.  The ureters were identified during multiple points of the case and were always out of the field of dissection.  On cystoscopy, the bladder was intact and bilateral spill was seen filling bladder but not directly from each ureteral oriface.    Technique:  After adequate anesthesia was achieved the patient was positioned, prepped and draped in usual sterile fashion.  A speculum was placed in the vagina and the cervix dilated with pratt dilators.  The Advincula with the 2.5 cm Koh ring placed in proper fashion.  The  Speculum was removed and the bladder catheterized with a foley.    Attention was turned to the abdomen where a 1 cm incision was made above the umbilicus.  The veress needle was inserted without aspiration of bowel contents or blood.  The 5 mm robotic trocar was placed and the other three trocar sites were marked  out, all approximately 10 cm from each other and the airseal trocar about 3 am above the camera plain. An 5 mm airseal trocar was introduced above the camera plane and the other 8.5 mm trocars were placed on either side of the camera port.  All trocars were inserted under direct visualization of the camera.  The patient was placed in trendelenburg and then the Robot docked.  The bipolar forceps were placed on arm 1 and the Hot shears on arm 3 and introduced under direct visualization of the camera.   I then broke scrub and sat down at the console.  The above findings were noted and the ureters identified well out of the field of dissection.  The right round ligament was identified and cauterized with the bipolar.  It  was then divided with the bipolar cautery and shears.  The peritoneum parallel to the IP ligament was carefully incised and the posterior broad ligament was then incised under the right IP ligament with care to be above the ureter.  The ovaries were removed from the uterus with sharp and blunt dissection. Endmetrial implants were all in this plane. The R IP ligament was cauterized with the bipolar and then incised with the shears.  The bowel was carefully removed from the posterior uterus and then the Broad and cardinal ligaments were then cauterized against the cervix to the level of the Koh ring, securing the uterine artery.  Each pedicle was then incised with the shears.  The anterior leaf was then incised  at the reflection of the vessico-uterine junction and the lateral bladder retracted inferiorly after the round ligament had been divided with the bipolar forceps.  The L round ligament was cauterized with the bipolar and divided with the shears;  then the left IP ligament divided with the bipolar forceps and the scissors in the same manner as the right. The broad and cardinal ligaments were then cauterized on the left in the same way.   At the level of the internal os, the uterine arteries were  bilaterally cauterized with the bipolar.  The ureters were identified well out of the field of dissection.   The anterior peritoneum was tented up and incised with the monopolar and the bladder retracted inferiorly.  The vesicovaginal fascia was incised with the monopolar shears and then pushed inferiorly off the vagina to about one cm below the Koh ring.   The uterus was amputated at the level of the reflection of the vagina onto the cervix with monopolar cautery and then removed through the vagina.  The pedicles were checked with the gas pressure down and found to be hemostatic.  The vaginal cuff was closed with a running stitch of 2-0 V-Loc.  The Robot was undocked and Arista and Ropivicaine placed inside the peritoneal cavity at the cuff.  All instruments and trocars were withdrawn from the abdomen, the abdomen desufflated.  The skin incisions were closed with 4-0 vicryl R and dermabond.  The cystoscope was placed in the bladder and spill was implied but not directly seen from the bilateral ureteral orifices even after fluids and lasix.  The bladder was intact.    The patient tolerated the procedure well and was returned to the recovery room in stable condition.

## 2022-07-19 NOTE — Anesthesia Procedure Notes (Addendum)
Procedure Name: Intubation Date/Time: 07/19/2022 1:34 PM  Performed by: Mechele Claude, CRNAPre-anesthesia Checklist: Patient identified, Emergency Drugs available, Suction available and Patient being monitored Patient Re-evaluated:Patient Re-evaluated prior to induction Oxygen Delivery Method: Circle system utilized Preoxygenation: Pre-oxygenation with 100% oxygen Induction Type: IV induction Ventilation: Mask ventilation without difficulty Laryngoscope Size: Mac and 3 Grade View: Grade I Tube type: Oral Tube size: 7.0 mm Number of attempts: 1 Airway Equipment and Method: Stylet and Oral airway Placement Confirmation: ETT inserted through vocal cords under direct vision, positive ETCO2 and breath sounds checked- equal and bilateral Secured at: 22 cm Tube secured with: Tape Dental Injury: Teeth and Oropharynx as per pre-operative assessment

## 2022-07-19 NOTE — Progress Notes (Signed)
There has been no change in the patients history, status or exam since the history and physical.  Vitals:   07/05/22 1005 07/10/22 1359 07/19/22 1207  BP:   131/76  Pulse:   73  Resp:   16  Temp:   98.2 F (36.8 C)  TempSrc:   Oral  SpO2:   98%  Weight:  77.1 kg 77.2 kg  Height: '5\' 4"'$  (1.626 m)  '5\' 4"'$  (1.626 m)    Results for orders placed or performed during the hospital encounter of 07/19/22 (from the past 72 hour(s))  Pregnancy, urine POC     Status: None   Collection Time: 07/19/22 11:51 AM  Result Value Ref Range   Preg Test, Ur NEGATIVE NEGATIVE    Comment:        THE SENSITIVITY OF THIS METHODOLOGY IS >24 mIU/mL     Daria Pastures

## 2022-07-19 NOTE — Brief Op Note (Signed)
07/19/2022  3:41 PM  PATIENT:  Patricia Griffin  46 y.o. female  PRE-OPERATIVE DIAGNOSIS:  uterine leiomyoma  POST-OPERATIVE DIAGNOSIS:  uterine leiomyoma  PROCEDURE:  Procedure(s): XI ROBOTIC ASSISTED TOTAL HYSTERECTOMY WITH BILATERAL SALPINGO-OOPHARECTOMY (N/A) CYSTOSCOPY (N/A)  SURGEON:  Surgeon(s) and Role:    * Bobbye Charleston, MD - Primary    * Rowland Lathe, MD - Assisting  ANESTHESIA:   general  EBL:  100 mL   LOCAL MEDICATIONS USED:  OTHER Ropivicaine, ARista  SPECIMEN:  Source of Specimen:  Uterus, bilateral tubes and ovaries, cervix.   DISPOSITION OF SPECIMEN:  PATHOLOGY  COUNTS:  YES  TOURNIQUET:  * No tourniquets in log *  DICTATION: .Note written in [place]: Admit for overnight observation  PATIENT DISPOSITION:  PACU - hemodynamically stable.   Delay start of Pharmacological VTE agent (>24hrs) due to surgical blood loss or risk of bleeding: not applicable

## 2022-07-19 NOTE — Progress Notes (Signed)
Pt with c/o continued, severe nausea despite zofran 4 mg IV at 1750 and alternative measures such as cooling cloths, cooling fans, and clear liquids only. MD notified, orders obtained for scopolamine patch and IV phenergan prn. Discussed phenergan dosage with pharmacist, pharmacist approved dosage and stated dosage is accurate. Will enact orders and continue to monitor. VSS.

## 2022-07-20 LAB — CBC
HCT: 38.6 % (ref 36.0–46.0)
Hemoglobin: 13.1 g/dL (ref 12.0–15.0)
MCH: 30.8 pg (ref 26.0–34.0)
MCHC: 33.9 g/dL (ref 30.0–36.0)
MCV: 90.8 fL (ref 80.0–100.0)
Platelets: 233 10*3/uL (ref 150–400)
RBC: 4.25 MIL/uL (ref 3.87–5.11)
RDW: 13 % (ref 11.5–15.5)
WBC: 13.2 10*3/uL — ABNORMAL HIGH (ref 4.0–10.5)
nRBC: 0 % (ref 0.0–0.2)

## 2022-07-20 LAB — CREATININE, SERUM
Creatinine, Ser: 1 mg/dL (ref 0.44–1.00)
GFR, Estimated: >60 mL/min (ref >60)

## 2022-07-20 MED ORDER — IBUPROFEN 800 MG PO TABS
ORAL_TABLET | ORAL | Status: AC
  Filled 2022-07-20: qty 1

## 2022-07-20 MED ORDER — OXYCODONE HCL 5 MG PO TABS: 5.0000 mg | ORAL_TABLET | ORAL | 0 refills | Status: AC | PRN

## 2022-07-20 MED ORDER — OXYCODONE HCL 5 MG PO TABS
ORAL_TABLET | ORAL | Status: AC
  Filled 2022-07-20: qty 2

## 2022-07-20 MED ORDER — IBUPROFEN 800 MG PO TABS: 800.0000 mg | ORAL_TABLET | Freq: Three times a day (TID) | ORAL | 0 refills | Status: AC

## 2022-07-20 MED ORDER — ENOXAPARIN SODIUM 40 MG/0.4ML IJ SOSY
PREFILLED_SYRINGE | INTRAMUSCULAR | Status: AC
  Filled 2022-07-20: qty 0.4

## 2022-07-20 NOTE — Progress Notes (Signed)
Patient is eating, ambulating, and voiding.  Pain control is good.  Vitals:   07/19/22 1910 07/19/22 2135 07/20/22 0126 07/20/22 0506  BP: 122/71 128/79 (!) 92/44 108/64  Pulse: 81 92 83 80  Resp: '14 18 18 20  '$ Temp: 98.2 F (36.8 C) 97.8 F (36.6 C) 98.1 F (36.7 C) 98.2 F (36.8 C)  TempSrc:      SpO2: 96% 97% 96% 94%  Weight:      Height:        lungs:   clear to auscultation cor:    RRR Abdomen:  soft, appropriate tenderness, incisions intact and without erythema or exudate. ex:    no cords   Lab Results  Component Value Date   WBC 13.2 (H) 07/20/2022   HGB 13.1 07/20/2022   HCT 38.6 07/20/2022   MCV 90.8 07/20/2022   PLT 233 07/20/2022    A/P  Pt doing well this am- had a lot of nausea yesterday but eating today.  Pain on right side is mild.    Cr is 1 today but in 2019 was 0.91.  Not real increase.  Will d/c to home and f/u one week.  Will have low threshold to check CT for ureters but during case they were well our of field of dissection despite not being able to see ureteral jets on cysto.     PCP did not mention specific anticoagulation for PFO in note- will do one dose of prophylactic lovenox and then have pt take baby ASA with ibuprofen at home.

## 2022-07-20 NOTE — Anesthesia Postprocedure Evaluation (Signed)
Anesthesia Post Note  Patient: Patricia Griffin  Procedure(s) Performed: XI ROBOTIC ASSISTED TOTAL HYSTERECTOMY WITH BILATERAL SALPINGO-OOPHARECTOMY (Abdomen) CYSTOSCOPY (Bladder)     Patient location during evaluation: PACU Anesthesia Type: General Level of consciousness: awake and alert Pain management: pain level controlled Vital Signs Assessment: post-procedure vital signs reviewed and stable Respiratory status: spontaneous breathing, nonlabored ventilation, respiratory function stable and patient connected to nasal cannula oxygen Cardiovascular status: blood pressure returned to baseline and stable Postop Assessment: no apparent nausea or vomiting Anesthetic complications: no   No notable events documented.  Last Vitals:  Vitals:   07/20/22 0506 07/20/22 0810  BP: 108/64 139/82  Pulse: 80 87  Resp: 20 18  Temp: 36.8 C 36.8 C  SpO2: 94% 99%    Last Pain:  Vitals:   07/20/22 0810  TempSrc:   PainSc: 1                  Kaizlee Carlino

## 2022-07-21 ENCOUNTER — Encounter (HOSPITAL_BASED_OUTPATIENT_CLINIC_OR_DEPARTMENT_OTHER): Payer: Self-pay | Admitting: Obstetrics and Gynecology

## 2022-07-21 LAB — SURGICAL PATHOLOGY

## 2022-07-26 ENCOUNTER — Encounter (HOSPITAL_COMMUNITY): Payer: Self-pay

## 2022-07-26 ENCOUNTER — Emergency Department (HOSPITAL_COMMUNITY): Payer: Self-pay

## 2022-07-26 ENCOUNTER — Ambulatory Visit (HOSPITAL_COMMUNITY)
Admission: RE | Admit: 2022-07-26 | Discharge: 2022-07-26 | Disposition: A | Discharge status: Home / Self Care | Payer: No Typology Code available for payment source | Source: Ambulatory Visit | Attending: Obstetrics and Gynecology | Admitting: Obstetrics and Gynecology

## 2022-07-26 ENCOUNTER — Other Ambulatory Visit: Payer: Self-pay | Admitting: Obstetrics and Gynecology

## 2022-07-26 ENCOUNTER — Emergency Department (HOSPITAL_COMMUNITY)
Admission: EM | Admit: 2022-07-26 | Discharge: 2022-07-26 | Disposition: A | Discharge status: Home / Self Care | Payer: Self-pay | Attending: Emergency Medicine | Admitting: Emergency Medicine

## 2022-07-26 ENCOUNTER — Other Ambulatory Visit: Payer: Self-pay | Admitting: Physician Assistant

## 2022-07-26 ENCOUNTER — Other Ambulatory Visit (HOSPITAL_COMMUNITY): Payer: Self-pay | Admitting: Obstetrics and Gynecology

## 2022-07-26 DIAGNOSIS — R110 Nausea: Secondary | ICD-10-CM

## 2022-07-26 DIAGNOSIS — R109 Unspecified abdominal pain: Secondary | ICD-10-CM

## 2022-07-26 DIAGNOSIS — E876 Hypokalemia: Secondary | ICD-10-CM | POA: Insufficient documentation

## 2022-07-26 DIAGNOSIS — R112 Nausea with vomiting, unspecified: Secondary | ICD-10-CM

## 2022-07-26 DIAGNOSIS — R7401 Elevation of levels of liver transaminase levels: Secondary | ICD-10-CM | POA: Insufficient documentation

## 2022-07-26 DIAGNOSIS — R630 Anorexia: Secondary | ICD-10-CM | POA: Insufficient documentation

## 2022-07-26 LAB — CBC WITH DIFFERENTIAL/PLATELET
Abs Immature Granulocytes: 0.07 10*3/uL (ref 0.00–0.07)
Basophils Absolute: 0.1 10*3/uL (ref 0.0–0.1)
Basophils Relative: 1 %
Eosinophils Absolute: 0.4 10*3/uL (ref 0.0–0.5)
Eosinophils Relative: 4 %
HCT: 43.6 % (ref 36.0–46.0)
Hemoglobin: 14.9 g/dL (ref 12.0–15.0)
Immature Granulocytes: 1 %
Lymphocytes Relative: 21 %
Lymphs Abs: 2 10*3/uL (ref 0.7–4.0)
MCH: 31.3 pg (ref 26.0–34.0)
MCHC: 34.2 g/dL (ref 30.0–36.0)
MCV: 91.6 fL (ref 80.0–100.0)
Monocytes Absolute: 0.6 10*3/uL (ref 0.1–1.0)
Monocytes Relative: 6 %
Neutro Abs: 6.6 10*3/uL (ref 1.7–7.7)
Neutrophils Relative %: 67 %
Platelets: 266 10*3/uL (ref 150–400)
RBC: 4.76 MIL/uL (ref 3.87–5.11)
RDW: 13.2 % (ref 11.5–15.5)
WBC: 9.6 10*3/uL (ref 4.0–10.5)
nRBC: 0 % (ref 0.0–0.2)

## 2022-07-26 LAB — COMPREHENSIVE METABOLIC PANEL
ALT: 397 U/L — ABNORMAL HIGH (ref 0–44)
AST: 239 U/L — ABNORMAL HIGH (ref 15–41)
Albumin: 4.1 g/dL (ref 3.5–5.0)
Alkaline Phosphatase: 84 U/L (ref 38–126)
Anion gap: 10 (ref 5–15)
BUN: 10 mg/dL (ref 6–20)
CO2: 31 mmol/L (ref 22–32)
Calcium: 9.1 mg/dL (ref 8.9–10.3)
Chloride: 95 mmol/L — ABNORMAL LOW (ref 98–111)
Creatinine, Ser: 0.83 mg/dL (ref 0.44–1.00)
GFR, Estimated: >60 mL/min (ref >60)
Glucose, Bld: 102 mg/dL — ABNORMAL HIGH (ref 70–99)
Potassium: 2.8 mmol/L — ABNORMAL LOW (ref 3.5–5.1)
Sodium: 136 mmol/L (ref 135–145)
Total Bilirubin: 0.8 mg/dL (ref 0.3–1.2)
Total Protein: 7.2 g/dL (ref 6.5–8.1)

## 2022-07-26 LAB — URINALYSIS, ROUTINE W REFLEX MICROSCOPIC
Bilirubin Urine: NEGATIVE
Glucose, UA: NEGATIVE mg/dL
Ketones, ur: NEGATIVE mg/dL
Leukocytes,Ua: NEGATIVE
Nitrite: NEGATIVE
Protein, ur: NEGATIVE mg/dL
Specific Gravity, Urine: 1.008 (ref 1.005–1.030)
pH: 7 (ref 5.0–8.0)

## 2022-07-26 LAB — LIPASE, BLOOD: Lipase: 34 U/L (ref 11–51)

## 2022-07-26 MED ORDER — DIPHENHYDRAMINE HCL 50 MG PO CAPS: ORAL_CAPSULE | ORAL | 0 refills | Status: AC

## 2022-07-26 MED ORDER — PREDNISONE 50 MG PO TABS: ORAL_TABLET | ORAL | 0 refills | Status: AC

## 2022-07-26 MED ORDER — MAGNESIUM SULFATE 2 GM/50ML IV SOLN
2.0000 g | Freq: Once | INTRAVENOUS | Status: AC
  Administered 2022-07-26: 2 g via INTRAVENOUS
  Filled 2022-07-26: qty 50

## 2022-07-26 MED ORDER — ACETAMINOPHEN 325 MG PO TABS
ORAL_TABLET | ORAL | Status: AC
  Filled 2022-07-26: qty 2

## 2022-07-26 MED ORDER — METHYLPREDNISOLONE SODIUM SUCC 40 MG IJ SOLR
40.0000 mg | Freq: Once | INTRAMUSCULAR | Status: AC
  Administered 2022-07-26: 40 mg via INTRAVENOUS
  Filled 2022-07-26: qty 1

## 2022-07-26 MED ORDER — ACETAMINOPHEN 500 MG PO TABS
1000.0000 mg | ORAL_TABLET | Freq: Once | ORAL | Status: AC
  Administered 2022-07-26: 1000 mg via ORAL
  Filled 2022-07-26: qty 2

## 2022-07-26 MED ORDER — DIPHENHYDRAMINE HCL 50 MG/ML IJ SOLN
50.0000 mg | Freq: Once | INTRAMUSCULAR | Status: AC
  Administered 2022-07-26: 50 mg via INTRAVENOUS
  Filled 2022-07-26: qty 1

## 2022-07-26 MED ORDER — ACETAMINOPHEN 325 MG PO TABS: 650.0000 mg | ORAL_TABLET | Freq: Once | ORAL | Status: DC

## 2022-07-26 MED ORDER — DIPHENHYDRAMINE HCL 50 MG/ML IJ SOLN: 50.0000 mg | Freq: Once | INTRAMUSCULAR | Status: AC

## 2022-07-26 MED ORDER — DIPHENHYDRAMINE HCL 25 MG PO CAPS
50.0000 mg | ORAL_CAPSULE | Freq: Once | ORAL | Status: AC
  Administered 2022-07-26: 50 mg via ORAL
  Filled 2022-07-26: qty 2

## 2022-07-26 MED ORDER — IOHEXOL 300 MG/ML  SOLN
100.0000 mL | Freq: Once | INTRAMUSCULAR | Status: AC | PRN
  Administered 2022-07-26: 100 mL via INTRAVENOUS

## 2022-07-26 MED ORDER — ONDANSETRON 8 MG PO TBDP
8.0000 mg | ORAL_TABLET | Freq: Once | ORAL | Status: AC
  Administered 2022-07-26: 8 mg via ORAL
  Filled 2022-07-26: qty 1

## 2022-07-26 MED ORDER — POTASSIUM CHLORIDE 10 MEQ/100ML IV SOLN
10.0000 meq | INTRAVENOUS | Status: AC
  Administered 2022-07-26 (×2): 10 meq via INTRAVENOUS
  Filled 2022-07-26 (×2): qty 100

## 2022-07-26 MED ORDER — POTASSIUM CHLORIDE ER 10 MEQ PO TBCR: 10.0000 meq | EXTENDED_RELEASE_TABLET | Freq: Two times a day (BID) | ORAL | 0 refills | Status: AC

## 2022-07-26 MED ORDER — SODIUM CHLORIDE (PF) 0.9 % IJ SOLN
INTRAMUSCULAR | Status: AC
  Filled 2022-07-26: qty 50

## 2022-07-26 MED ORDER — STERILE WATER FOR INJECTION IJ SOLN
INTRAMUSCULAR | Status: AC
  Filled 2022-07-26: qty 10

## 2022-07-26 MED ORDER — HYDROMORPHONE HCL 2 MG/ML IJ SOLN: 1.0000 mg | Freq: Once | INTRAMUSCULAR | Status: DC

## 2022-07-26 NOTE — ED Provider Notes (Signed)
Accepted handoff at shift change from [place], [place]. Please see prior provider note for more detail.   Briefly: Patient is 46 y.o. 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  Patient states her symptoms started after the hysterectomy.  She has been feeling nauseated since then without any emesis.  Seen by gynecologist this morning, sent for CT abdomen pelvis with and without contrast.  Due to patient's contrast allergy patient arrived to ED for CT scan with shorter prep.  The concern is that there could have been ureter injury during the procedure.  She does not have any dysuria or hematuria.  Endorses decreased appetite and decreased oral intake secondary to that.  She has also been having intermittent suprapubic pain which comes and goes.  Is worse with pressure, denies any pelvic pain, vaginal pain, vaginal discharge, vaginal bleeding.  She has been taking her pain medicine which keeps her pain well controlled.  She denies any chest pain, shortness of breath, syncopal events.   Patient had increase in her dose of methotrexate about 2 weeks ago.  Interventions: Replaced K/Mag, Zofran, Tylenol, Solumedrol/Benadryl  Workup thus far reveals Hypokalemia (2.8, replaced), Transaminitis (239/397) likely 2/2 to recent methotrexate.  DDX: concern for possible ureteral injury from procedure  Plan: CT a/p after 4 hour contrast allergy treatment      Physical Exam  BP 121/82   Pulse 84   Temp 98 F (36.7 C) (Oral)   Resp 18   Ht '5\' 4"'$  (1.626 m)   Wt 77.2 kg   SpO2 100%   BMI 29.21 kg/m   Physical Exam Vitals and nursing note reviewed.  Constitutional:      General: She is not in acute distress.    Appearance: Normal appearance. She is well-developed. She is not ill-appearing,  toxic-appearing or diaphoretic.  HENT:     Head: Normocephalic and atraumatic.     Nose: No nasal deformity.     Mouth/Throat:     Lips: Pink. No lesions.  Eyes:     General: Gaze aligned appropriately. No scleral icterus.       Right eye: No discharge.        Left eye: No discharge.     Conjunctiva/sclera: Conjunctivae normal.     Right eye: Right conjunctiva is not injected. No exudate or hemorrhage.    Left eye: Left conjunctiva is not injected. No exudate or hemorrhage. Pulmonary:     Effort: Pulmonary effort is normal. No respiratory distress.  Abdominal:     General: Abdomen is flat. There is no distension.     Palpations: Abdomen is soft.     Tenderness: There is no abdominal tenderness. There is no right CVA tenderness, left CVA tenderness, guarding or rebound.  Skin:    General: Skin is warm and dry.  Neurological:     Mental Status: She is alert and oriented to person, place, and time.  Psychiatric:        Mood and Affect: Mood normal.        Speech: Speech normal.        Behavior: Behavior normal. Behavior is cooperative.     Procedures  Procedures  ED Course / MDM   Clinical Course as of 07/26/22 1733  Wed Jul 26, 2022  1445 Awaiting CT a/p Recent hysterectomy.  Contrast  allergy.  Anticipate discharge. [GL]    Clinical Course User Index [GL] Sherre Poot Adora Fridge, PA-C   Medical Decision Making Amount and/or Complexity of Data Reviewed Labs: ordered. Radiology: ordered.  Risk OTC drugs. Prescription drug management.    CT scan results revealed no organized fluid collection, no evidence of urine leak or ureter damage, no other acute abnormalities to explain patient's symptoms  On reassessment, patient still has no abdominal pain.  Her nausea has improved.  She has a prescription for Zofran and potassium supplementation at home.  She has not had any further vomiting here and has tolerated p.o. She is stable for discharge.  I have recommended close f/u  with PCP and OBGYN to evaluate transaminitis and continuation of symptoms.      Sheila Oats 07/26/22 1733    Fransico Meadow, MD 07/26/22 2012

## 2022-07-26 NOTE — ED Provider Triage Note (Deleted)
Emergency Medicine Provider Triage Evaluation Note  [name] , a 46 y.o. female  was evaluated in triage.  Pt complains of nausea.  She had hysterectomy last Wednesday, seen by gynecology earlier today who wanted her to get a CT abdomen pelvis with contrast to evaluate "the ureters".  Patient has a contrast allergy, sent for premedication.  Patient denies any specific abdominal pain, vomiting, fevers, pelvic pain.  She does have some tenderness over the scar site but denies any redness or pus drainage.  Review of Systems  Per HPI  Physical Exam  BP 116/69 (BP Location: Left Arm)   Pulse 90   Temp 98 F (36.7 C) (Oral)   Resp 18   SpO2 99%  Gen:   Awake, no distress   Resp:  Normal effort  MSK:   Moves extremities without difficulty  Other:  Incisional sites without any surrounding erythema.  Mild suprapubic tenderness.  Medical Decision Making  Medically screening exam initiated at 10:55 AM.  Appropriate orders placed.  [name] was informed that the remainder of the evaluation will be completed by another provider, this initial triage assessment does not replace that evaluation, and the importance of remaining in the ED until their evaluation is complete.  I called patient's [facility].  Outpatient CT was ordered, patient went there and was told by radiology per patient that the prep in the ED "takes less time".  I asked what they were worried about and receptionist relayed just postop pain and nausea broad CT imaging.     Sherrill Raring, PA-C 07/26/22 [address], [place] 07/26/22 [phone]

## 2022-07-26 NOTE — Discharge Instructions (Addendum)
Your CT today was very reassuring. Please schedule a follow up with your OBGYN or PCP.

## 2022-07-26 NOTE — ED Triage Notes (Signed)
Pt arrived via POV, c/o dizziness, nausea, pelvic pain. States recent hysterectomy, spoke with OBGYN, who told pt to go to ED for CT

## 2022-07-26 NOTE — ED Provider Notes (Signed)
[facility] DEPT Provider Note   CSN: [id number] Arrival date & time: 07/26/22  1011     History  Chief Complaint  Patient presents with   Nausea    Patricia Griffin is a 46 y.o. female.  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  Patient states her symptoms started after the hysterectomy.  She has been feeling nauseated since then without any emesis.  Seen by gynecologist this morning, sent for CT abdomen pelvis with and without contrast.  Due to patient's contrast allergy patient arrived to ED for CT scan with shorter prep.  The concern is that there could have been ureter injury during the procedure.  She does not have any dysuria or hematuria.  Endorses decreased appetite and decreased oral intake secondary to that.  She has also been having intermittent suprapubic pain which comes and goes.  Is worse with pressure, denies any pelvic pain, vaginal pain, vaginal discharge, vaginal bleeding.  She has been taking her pain medicine which keeps her pain well controlled.  She denies any chest pain, shortness of breath, syncopal events.  Patient had increase in her dose of methotrexate about 2 weeks ago.  Home Medications Prior to Admission medications   Medication Sig Start Date End Date Taking? Authorizing Provider  potassium chloride (KLOR-CON) 10 MEQ tablet Take 1 tablet (10 mEq total) by mouth 2 (two) times daily. 07/26/22  Yes Sherrill Raring, PA-C  buPROPion (WELLBUTRIN XL) 300 MG 24 hr tablet Take 300 mg by mouth daily.    [provider]  cyclobenzaprine (FLEXERIL) 10 MG tablet Take 10 mg by mouth 3 (three) times daily as needed for muscle spasms.    [provider]  diazepam (VALIUM) 10 MG tablet Take 10 mg by mouth every 6 (six)  hours as needed for anxiety.    [provider]  dicyclomine (BENTYL) 20 MG tablet Take 20 mg by mouth as needed for spasms.    [provider]  diphenhydrAMINE (BENADRYL) 50 MG capsule Take one capsule 1 hour prior to scan on 8/31 at 10 am 07/26/22   Candiss Norse A, PA-C  doxepin (SINEQUAN) 10 MG capsule Take 10 mg by mouth at bedtime as needed.    [provider]  famotidine (PEPCID) 20 MG tablet Take 20 mg by mouth 2 (two) times daily.    [provider]  fexofenadine (ALLEGRA) 60 MG tablet Take 60 mg by mouth 2 (two) times daily.    [provider]  fluticasone (FLONASE) 50 MCG/ACT nasal spray Place into both nostrils daily.    [provider]  folic acid (FOLVITE) 1 MG tablet Take 1 mg by mouth daily. Takes 2 tablet daily    [provider]  furosemide (LASIX) 20 MG tablet Take 20 mg by mouth as needed.    [provider]  hydrochlorothiazide (HYDRODIURIL) 25 MG tablet Take 25 mg by mouth every evening.     [provider]  ibuprofen (ADVIL) 800 MG tablet Take 1 tablet (800 mg total) by mouth 3 (three) times daily. 07/20/22   Bobbye Charleston, MD  levothyroxine (SYNTHROID, LEVOTHROID) 50 MCG tablet Take 50 mcg by mouth every morning.     [provider]  losartan (COZAAR) 50 MG tablet Take 50 mg by mouth daily.    [provider]  methotrexate (RHEUMATREX) 2.5 MG tablet Take 2.5 mg by mouth once a week. Caution:Chemotherapy. Protect from light. Currently taking 6 tablets of 2.5 mg one day per week as of 07/10/22.    [provider]  montelukast (SINGULAIR) 10 MG tablet Take 10 mg by mouth at bedtime.    [provider]  multivitamin-iron-minerals-folic acid (CENTRUM) chewable tablet Chew 1 tablet by mouth daily.      [provider]  norethindrone (MICRONOR) 0.35 MG tablet Take 1 tablet by mouth daily.    [provider]  oxyCODONE (OXY IR/ROXICODONE) 5 MG  immediate release tablet Take 1-2 tablets (5-10 mg total) by mouth every 4 (four) hours as needed for moderate pain. 07/20/22   Bobbye Charleston, MD  predniSONE (DELTASONE) 10 MG tablet Take 10 mg by mouth in the morning and at bedtime. With taper    [provider]  predniSONE (DELTASONE) 50 MG tablet Take one tablet 13 hours, 7 hours, and 1 hour prior to scan on 8/31 at 10 am 07/26/22   Candiss Norse A, PA-C  valACYclovir (VALTREX) 1000 MG tablet Take 1,000 mg by mouth as needed.     [provider]      Allergies    Iodine, Morphine and related, and Shellfish allergy    Review of Systems   Review of Systems  Physical Exam Updated Vital Signs BP 128/88   Pulse 76   Temp 98 F (36.7 C) (Oral)   Resp 15   Ht '5\' 4"'$  (1.626 m)   Wt 77.2 kg   SpO2 98%   BMI 29.21 kg/m  Physical Exam Vitals and nursing note reviewed. Exam conducted with a chaperone present.  Constitutional:      Appearance: Normal appearance.  HENT:     Head: Normocephalic and atraumatic.  Eyes:     General: No scleral icterus.       Right eye: No discharge.        Left eye: No discharge.     Extraocular Movements: Extraocular movements intact.     Pupils: Pupils are equal, round, and reactive to light.  Cardiovascular:     Rate and Rhythm: Normal rate and regular rhythm.     Pulses: Normal pulses.     Heart sounds: Normal heart sounds. No murmur heard.    No friction rub. No gallop.  Pulmonary:     Effort: Pulmonary effort is normal. No respiratory distress.     Breath sounds: Normal breath sounds.     Comments: Lungs are clear to auscultation bilaterally. Abdominal:     General: Abdomen is flat. Bowel sounds are normal. There is no distension.     Palpations: Abdomen is soft.     Tenderness: There is no abdominal tenderness.     Comments: Laparoscopic incision sites present but no overlying erythema or purulent discharge.  Abdomen is soft, no focal tenderness.  Musculoskeletal:      Right lower leg: No edema.     Left lower leg: No edema.  Skin:    General: Skin is warm and dry.     Coloration: Skin is not jaundiced.  Neurological:     Mental Status: She is alert. Mental status is at baseline.     Coordination: Coordination normal.     ED Results / Procedures / Treatments   Labs (all labs ordered are listed, but only abnormal results are displayed) Labs Reviewed  COMPREHENSIVE METABOLIC PANEL - Abnormal; Notable for the following components:  Result Value   Potassium 2.8 (*)    Chloride 95 (*)    Glucose, Bld 102 (*)    AST 239 (*)    ALT 397 (*)    All other components within normal limits  URINALYSIS, ROUTINE W REFLEX MICROSCOPIC - Abnormal; Notable for the following components:   Hgb urine dipstick MODERATE (*)    Bacteria, UA RARE (*)    All other components within normal limits  CBC WITH DIFFERENTIAL/PLATELET  LIPASE, BLOOD    EKG None  Radiology No results found.  Procedures .Critical Care  Performed by: Sherrill Raring, PA-C Authorized by: Sherrill Raring, PA-C   Critical care provider statement:    Critical care time (minutes):  30   Critical care start time:  07/26/2022 12:58 PM   Critical care end time:  07/26/2022 1:28 PM   Critical care was necessary to treat or prevent imminent or life-threatening deterioration of the following conditions:  Metabolic crisis (Hypokalemia)   Critical care was time spent personally by me on the following activities:  Development of treatment plan with patient or surrogate, discussions with consultants, evaluation of patient's response to treatment, examination of patient, ordering and review of laboratory studies, ordering and review of radiographic studies, ordering and performing treatments and interventions, pulse oximetry, re-evaluation of patient's condition and review of old charts     Medications Ordered in ED Medications  potassium chloride 10 mEq in 100 mL IVPB (10 mEq Intravenous New Bag/Given  07/26/22 1347)  methylPREDNISolone sodium succinate (SOLU-MEDROL) 40 mg/mL injection 40 mg (40 mg Intravenous Given 07/26/22 1241)  diphenhydrAMINE (BENADRYL) capsule 50 mg (50 mg Oral Given 07/26/22 1415)    Or  diphenhydrAMINE (BENADRYL) injection 50 mg ( Intravenous See Alternative 07/26/22 1415)  ondansetron (ZOFRAN-ODT) disintegrating tablet 8 mg (8 mg Oral Given 07/26/22 1242)  acetaminophen (TYLENOL) tablet 1,000 mg (1,000 mg Oral Given 07/26/22 1238)  magnesium sulfate IVPB 2 g 50 mL (0 g Intravenous Stopped 07/26/22 1340)  sterile water (preservative free) injection (  Given 07/26/22 1248)    ED Course/ Medical Decision Making/ A&P Clinical Course as of 07/26/22 1457  Wed Jul 26, 2022  1445 Awaiting CT a/p Recent hysterectomy.  Contrast allergy.  Anticipate discharge. [GL]    Clinical Course User Index [GL] Sherre Poot Adora Fridge, PA-C                           Medical Decision Making Amount and/or Complexity of Data Reviewed Labs: ordered. Radiology: ordered.  Risk OTC drugs. Prescription drug management.   Patient presents due to possible postop complication/nausea.  Differential includes but not limited to sepsis, AKI, dehydration, ureteral injury, postop surgical complication.  Considered PE no chest pain, shortness of breath, hypoxia and I think less likely in the current clinical presentation.  On physical exam patient does not have any focal abdominal tenderness or peritoneal signs.  She does not appear septic.  Lungs clear to auscultation, regular rate and rhythm. -BP 132/76   Pulse 76   Temp 98 F (36.7 C) (Oral)   Resp 18   SpO2 100%   Reviewed external medical records.  Patient had a hysterectomy as detailed in HPI about a week ago.  Comorbidities documented in HPI that contribute to her care.  I consulted patient's OB gynecologist office, they are worried about possible ureteral injury from the procedure.  Patient has a history of CT scan, they recommend CT  abdomen pelvis with and  without contrast for referral evaluation with premedication.  I ordered, viewed and interpreted laboratory work-up. CBC without leukocytosis or underlying anemia. Lipase is unremarkable. CMP notable for hypokalemia of 2.8, slight decrease in bicarb at 95.  Patient's AST and ALT are elevated AST 239 and ALT 397 respectively.  Per chart review 3 weeks ago no transaminitis.  I think this is likely contributable due to methotrexate given dosage increased about 2 weeks ago.  I have ordered fluids, Zofran, potassium supplementation and magnesium supplementation.  Patient was offered narcotic medicine, declined saying she would prefer Tylenol which was ordered instead.  On reevaluation patient's pain is improved somewhat.  Considered pelvic patient was seen by gynecologist earlier today and is not having any pelvic pain, do not think indicated given patient is having nausea with suprapubic pain.    Disposition is pending CT abdomen pelvis with contrast.  Patient has been premedicated, scan has not been obtained at time of my shift ending.  If CT abdomen pelvis is negative for any acute process I do feel patient is appropriate to follow-up with a gynecologist in the outpatient setting.    Case signed out to [facility]         Final Clinical Impression(s) / ED Diagnoses Final diagnoses:  Nausea  Hypokalemia  Transaminitis    Rx / DC Orders ED Discharge Orders          Ordered    potassium chloride (KLOR-CON) 10 MEQ tablet  2 times daily        07/26/22 1400              Sherrill Raring, PA-C 07/26/22 1457    Lacretia Leigh, MD 07/27/22 [phone]

## 2022-07-27 ENCOUNTER — Ambulatory Visit (HOSPITAL_COMMUNITY): Payer: No Typology Code available for payment source

## 2022-08-01 ENCOUNTER — Other Ambulatory Visit (HOSPITAL_COMMUNITY): Payer: No Typology Code available for payment source
